# Patient Record
Sex: Male | Born: 1980 | Race: White | Hispanic: No | Smoking: Former smoker
Health system: Southern US, Community
[De-identification: ages and names within clinical notes are randomized; demographics above are authoritative.]

## PROBLEM LIST (undated history)

## (undated) ENCOUNTER — Encounter

## (undated) DIAGNOSIS — C8192 Hodgkin lymphoma, unspecified, intrathoracic lymph nodes: Principal | ICD-10-CM

## (undated) DIAGNOSIS — M549 Dorsalgia, unspecified: Secondary | ICD-10-CM

## (undated) DIAGNOSIS — F1011 Alcohol abuse, in remission: Secondary | ICD-10-CM

## (undated) DIAGNOSIS — I10 Essential (primary) hypertension: Secondary | ICD-10-CM

## (undated) DIAGNOSIS — J189 Pneumonia, unspecified organism: Secondary | ICD-10-CM

## (undated) DIAGNOSIS — R011 Cardiac murmur, unspecified: Secondary | ICD-10-CM

## (undated) DIAGNOSIS — F419 Anxiety disorder, unspecified: Secondary | ICD-10-CM

## (undated) DIAGNOSIS — G8929 Other chronic pain: Secondary | ICD-10-CM

## (undated) DIAGNOSIS — J9859 Other diseases of mediastinum, not elsewhere classified: Secondary | ICD-10-CM

## (undated) DIAGNOSIS — C801 Malignant (primary) neoplasm, unspecified: Secondary | ICD-10-CM

## (undated) HISTORY — PX: BACK SURGERY: SHX140

## (undated) HISTORY — DX: Hodgkin lymphoma, unspecified, intrathoracic lymph nodes: C81.92

---

## 2007-01-26 ENCOUNTER — Ambulatory Visit: Payer: Self-pay | Admitting: Family Medicine

## 2008-07-03 ENCOUNTER — Ambulatory Visit (HOSPITAL_COMMUNITY): Admission: RE | Admit: 2008-07-03 | Discharge: 2008-07-03 | Payer: Self-pay | Admitting: Orthopedic Surgery

## 2008-07-17 ENCOUNTER — Ambulatory Visit (HOSPITAL_COMMUNITY): Admission: RE | Admit: 2008-07-17 | Discharge: 2008-07-18 | Payer: Self-pay | Admitting: Orthopedic Surgery

## 2008-08-20 ENCOUNTER — Encounter: Admission: RE | Admit: 2008-08-20 | Discharge: 2008-10-08 | Payer: Self-pay | Admitting: Orthopedic Surgery

## 2009-12-22 ENCOUNTER — Emergency Department (HOSPITAL_BASED_OUTPATIENT_CLINIC_OR_DEPARTMENT_OTHER): Admission: EM | Admit: 2009-12-22 | Discharge: 2009-12-22 | Payer: Self-pay | Admitting: Emergency Medicine

## 2010-02-25 ENCOUNTER — Observation Stay (HOSPITAL_COMMUNITY): Admission: RE | Admit: 2010-02-25 | Discharge: 2010-02-26 | Payer: Self-pay | Admitting: Orthopedic Surgery

## 2010-02-25 ENCOUNTER — Encounter (INDEPENDENT_AMBULATORY_CARE_PROVIDER_SITE_OTHER): Payer: Self-pay | Admitting: Orthopedic Surgery

## 2010-04-06 ENCOUNTER — Encounter: Admission: RE | Admit: 2010-04-06 | Discharge: 2010-07-05 | Payer: Self-pay | Admitting: Orthopedic Surgery

## 2010-11-21 ENCOUNTER — Encounter: Payer: Self-pay | Admitting: Orthopedic Surgery

## 2011-01-18 LAB — PROTIME-INR: INR: 0.89 (ref 0.00–1.49)

## 2011-01-18 LAB — APTT: aPTT: 28 seconds (ref 24–37)

## 2011-01-18 LAB — COMPREHENSIVE METABOLIC PANEL
ALT: 19 U/L (ref 0–53)
Alkaline Phosphatase: 47 U/L (ref 39–117)
BUN: 9 mg/dL (ref 6–23)
CO2: 29 mEq/L (ref 19–32)
Chloride: 101 mEq/L (ref 96–112)
Creatinine, Ser: 1.11 mg/dL (ref 0.4–1.5)
GFR calc non Af Amer: >60 mL/min (ref >60)
Potassium: 4 mEq/L (ref 3.5–5.1)
Total Protein: 7.4 g/dL (ref 6.0–8.3)

## 2011-01-18 LAB — DIFFERENTIAL
Eosinophils Absolute: 0.2 10*3/uL (ref 0.0–0.7)
Eosinophils Relative: 3 % (ref 0–5)
Monocytes Absolute: 0.5 10*3/uL (ref 0.1–1.0)
Neutro Abs: 3.3 10*3/uL (ref 1.7–7.7)
Neutrophils Relative %: 53 % (ref 43–77)

## 2011-01-18 LAB — URINALYSIS, ROUTINE W REFLEX MICROSCOPIC
Glucose, UA: NEGATIVE mg/dL
Protein, ur: NEGATIVE mg/dL
pH: 7 (ref 5.0–8.0)

## 2011-01-18 LAB — CBC
HCT: 45 % (ref 39.0–52.0)
Hemoglobin: 15.4 g/dL (ref 13.0–17.0)
Platelets: 299 10*3/uL (ref 150–400)

## 2011-03-15 NOTE — Op Note (Signed)
NAME:  Kevin Reese, Kevin Reese            ACCOUNT NO.:  [id number]   MEDICAL RECORD NO.:  [id number]          PATIENT TYPE:  OIB   LOCATION:  1535                         FACILITY:  [facility]   PHYSICIAN:  Georges Lynch. Gioffre, M.D.DATE OF BIRTH:  Sep 05, 1981   DATE OF PROCEDURE:  07/17/2008  DATE OF DISCHARGE:  07/18/2008                               OPERATIVE REPORT   SURGEON:  Windy Fast A. Darrelyn Hillock, M.D.   ASSISTANT:  Marlowe Kays, M.D.   PREOPERATIVE DIAGNOSES:  1. 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  2. 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  3. Foot drop on the left.   POSTOPERATIVE DIAGNOSES:  1. 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  2. 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  3. Foot drop on the left.   OPERATION:  1. Decompression lateral recess with lateral recess stenosis at L4-5      on the left.  2. Microdiskectomy at L4-5 on the left for an extremely large ruptured      disk.   PROCEDURE IN DETAIL:  Sterile prep and draping of the left lower  extremity was carried out.  He had 2 grams of IV Ancef preop.  At this  time two needles were placed in the back for localization purposes and  an x-ray was taken.  Following that an incision was made over the  appropriate level.  Bleeders identified and cauterized.  The muscle was  stripped from the lamina and spinous processes bilaterally in order to  insert the [facility] retractors.  Following that I went down to the L4-  5 space on the left and another x-ray was taken to verify our position.  We then carried out hemilaminectomy in the usual fashion.  Note the dura  and root was extremely tight.  The recess was extremely tight as well.  We brought the microscope in, gently protected the dura and then went  out far laterally to decompress first the lateral recess.  We then went  down and did a foraminotomy for the S1 root.  Then we proceeded  proximally and  completed our dissection.  Following that we gently  brought the dura and root over the midline and went down and utilized a  nerve hook and removed an extremely large piece of disk.  Once that was  removed we now had good freedom of motion of the dura and the root.  We  then made a cruciate incision in the posterior longitudinal ligament and  went down and completed our diskectomy.  We went up proximally, distally  and medially and made sure there were no other loose fragments.  We  utilized the Epstein curette to go subligamentous as well as a nerve  hook.  We did a nice foraminotomy and freed up the S1 root.  Following  that we thoroughly irrigated out the area and the dura and root now were  easily movable.  We went back and made several attempts to make sure  there were no other free pieces of disk.  I then  loosely applied some  thrombin-soaked Gelfoam for hemostasis purposes and then closed the  wound in layers in the usual fashion except I left a small distal deep  part of the wound open for drainage purposes.  The subcu was closed with  Vicryl and the skin was closed with metal staples.  A sterile Neosporin  dressing was applied.  The patient left the operating room in  satisfactory condition.  He lost approximately 75 mL of blood.           ______________________________  Georges Lynch Darrelyn Hillock, M.D.     RAG/MEDQ  D:  07/17/2008  T:  07/20/2008  Job:  [id number]

## 2011-08-01 LAB — HEMOGLOBIN: Hemoglobin: 13.6

## 2011-08-01 LAB — RAPID URINE DRUG SCREEN, HOSP PERFORMED
Cocaine: NOT DETECTED
Opiates: POSITIVE — AB

## 2011-08-03 LAB — URINALYSIS, ROUTINE W REFLEX MICROSCOPIC
Bilirubin Urine: NEGATIVE
Leukocytes, UA: NEGATIVE
Nitrite: NEGATIVE
Specific Gravity, Urine: 1.013
pH: 7

## 2011-08-03 LAB — DIFFERENTIAL
Basophils Absolute: 0.1
Lymphocytes Relative: 28
Monocytes Absolute: 0.6
Monocytes Relative: 7
Neutro Abs: 5.5

## 2011-08-03 LAB — RAPID URINE DRUG SCREEN, HOSP PERFORMED
Barbiturates: NOT DETECTED
Benzodiazepines: POSITIVE — AB
Cocaine: POSITIVE — AB

## 2011-08-03 LAB — COMPREHENSIVE METABOLIC PANEL
Albumin: 5.1
BUN: 10
Creatinine, Ser: 1.16
Total Protein: 8.3

## 2011-08-03 LAB — CBC
HCT: 50.4
MCHC: 34.7
MCV: 87
Platelets: 338
RDW: 13.5

## 2011-08-03 LAB — URINE CULTURE: Special Requests: NEGATIVE

## 2011-08-03 LAB — PROTIME-INR: INR: 1

## 2011-08-03 LAB — APTT: aPTT: 28

## 2011-09-03 ENCOUNTER — Encounter: Payer: Self-pay | Admitting: *Deleted

## 2011-09-03 ENCOUNTER — Emergency Department (INDEPENDENT_AMBULATORY_CARE_PROVIDER_SITE_OTHER): Payer: No Typology Code available for payment source

## 2011-09-03 ENCOUNTER — Emergency Department (HOSPITAL_BASED_OUTPATIENT_CLINIC_OR_DEPARTMENT_OTHER): Payer: No Typology Code available for payment source

## 2011-09-03 ENCOUNTER — Emergency Department (HOSPITAL_BASED_OUTPATIENT_CLINIC_OR_DEPARTMENT_OTHER)
Admission: EM | Admit: 2011-09-03 | Discharge: 2011-09-04 | Disposition: A | Discharge status: Home / Self Care | Payer: No Typology Code available for payment source | Attending: Emergency Medicine | Admitting: Emergency Medicine

## 2011-09-03 DIAGNOSIS — S0003XA Contusion of scalp, initial encounter: Secondary | ICD-10-CM

## 2011-09-03 DIAGNOSIS — Z9119 Patient's noncompliance with other medical treatment and regimen: Secondary | ICD-10-CM | POA: Insufficient documentation

## 2011-09-03 DIAGNOSIS — R222 Localized swelling, mass and lump, trunk: Secondary | ICD-10-CM | POA: Insufficient documentation

## 2011-09-03 DIAGNOSIS — M5137 Other intervertebral disc degeneration, lumbosacral region: Secondary | ICD-10-CM

## 2011-09-03 DIAGNOSIS — S1093XA Contusion of unspecified part of neck, initial encounter: Secondary | ICD-10-CM

## 2011-09-03 DIAGNOSIS — S335XXA Sprain of ligaments of lumbar spine, initial encounter: Secondary | ICD-10-CM | POA: Insufficient documentation

## 2011-09-03 DIAGNOSIS — M549 Dorsalgia, unspecified: Secondary | ICD-10-CM

## 2011-09-03 DIAGNOSIS — W19XXXA Unspecified fall, initial encounter: Secondary | ICD-10-CM

## 2011-09-03 DIAGNOSIS — S39012A Strain of muscle, fascia and tendon of lower back, initial encounter: Secondary | ICD-10-CM

## 2011-09-03 DIAGNOSIS — J9859 Other diseases of mediastinum, not elsewhere classified: Secondary | ICD-10-CM

## 2011-09-03 DIAGNOSIS — I10 Essential (primary) hypertension: Secondary | ICD-10-CM | POA: Insufficient documentation

## 2011-09-03 DIAGNOSIS — M79609 Pain in unspecified limb: Secondary | ICD-10-CM

## 2011-09-03 DIAGNOSIS — R109 Unspecified abdominal pain: Secondary | ICD-10-CM

## 2011-09-03 DIAGNOSIS — K7689 Other specified diseases of liver: Secondary | ICD-10-CM

## 2011-09-03 DIAGNOSIS — Z91199 Patient's noncompliance with other medical treatment and regimen due to unspecified reason: Secondary | ICD-10-CM | POA: Insufficient documentation

## 2011-09-03 DIAGNOSIS — R079 Chest pain, unspecified: Secondary | ICD-10-CM

## 2011-09-03 DIAGNOSIS — M5126 Other intervertebral disc displacement, lumbar region: Secondary | ICD-10-CM

## 2011-09-03 DIAGNOSIS — IMO0002 Reserved for concepts with insufficient information to code with codable children: Secondary | ICD-10-CM

## 2011-09-03 HISTORY — DX: Anxiety disorder, unspecified: F41.9

## 2011-09-03 HISTORY — DX: Essential (primary) hypertension: I10

## 2011-09-03 LAB — DIFFERENTIAL
Lymphocytes Relative: 23 % (ref 12–46)
Monocytes Absolute: 0.7 10*3/uL (ref 0.1–1.0)
Monocytes Relative: 6 % (ref 3–12)
Neutro Abs: 8.4 10*3/uL — ABNORMAL HIGH (ref 1.7–7.7)
Neutrophils Relative %: 69 % (ref 43–77)

## 2011-09-03 LAB — CBC
HCT: 44 % (ref 39.0–52.0)
Hemoglobin: 15.3 g/dL (ref 13.0–17.0)
RBC: 5.3 MIL/uL (ref 4.22–5.81)
WBC: 12.2 10*3/uL — ABNORMAL HIGH (ref 4.0–10.5)

## 2011-09-03 MED ORDER — LIDOCAINE HCL 2 % IJ SOLN
INTRAMUSCULAR | Status: AC
  Administered 2011-09-03: 400 mg via INTRADERMAL
  Filled 2011-09-03: qty 1

## 2011-09-03 MED ORDER — OXYCODONE-ACETAMINOPHEN 5-325 MG PO TABS
2.0000 | ORAL_TABLET | Freq: Once | ORAL | Status: AC
  Administered 2011-09-03: 2 via ORAL
  Filled 2011-09-03: qty 2

## 2011-09-03 MED ORDER — TETANUS-DIPHTH-ACELL PERTUSSIS 5-2.5-18.5 LF-MCG/0.5 IM SUSP
0.5000 mL | Freq: Once | INTRAMUSCULAR | Status: AC
  Administered 2011-09-03: 0.5 mL via INTRAMUSCULAR
  Filled 2011-09-03: qty 0.5

## 2011-09-03 MED ORDER — LIDOCAINE HCL 2 % IJ SOLN
20.0000 mL | Freq: Once | INTRAMUSCULAR | Status: AC
  Administered 2011-09-03: 400 mg via INTRADERMAL

## 2011-09-03 MED ORDER — IBUPROFEN 800 MG PO TABS: 800.0000 mg | ORAL_TABLET | Freq: Three times a day (TID) | ORAL | Status: AC

## 2011-09-03 MED ORDER — HYDROCODONE-ACETAMINOPHEN 5-325 MG PO TABS: 2.0000 | ORAL_TABLET | ORAL | Status: AC | PRN
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
## 2011-09-03 NOTE — ED Notes (Signed)
Spine precautions d/c'd by dr Manus Gunning upon arrival to the ed.  Pt was initally on LSB and C-Collar

## 2011-09-04 LAB — COMPREHENSIVE METABOLIC PANEL
AST: 29 U/L (ref 0–37)
Albumin: 4.4 g/dL (ref 3.5–5.2)
Alkaline Phosphatase: 69 U/L (ref 39–117)
BUN: 11 mg/dL (ref 6–23)
CO2: 24 mEq/L (ref 19–32)
Chloride: 100 mEq/L (ref 96–112)
Creatinine, Ser: 1 mg/dL (ref 0.50–1.35)
GFR calc non Af Amer: >90 mL/min (ref >90)
Potassium: 3.6 mEq/L (ref 3.5–5.1)
Total Bilirubin: 0.4 mg/dL (ref 0.3–1.2)

## 2011-09-04 MED ORDER — IOHEXOL 300 MG/ML  SOLN
100.0000 mL | Freq: Once | INTRAMUSCULAR | Status: AC | PRN
  Administered 2011-09-04: 100 mL via INTRAVENOUS

## 2011-09-04 MED ORDER — HYDROCODONE-ACETAMINOPHEN 5-325 MG PO TABS: 2.0000 | ORAL_TABLET | ORAL | Status: AC | PRN

## 2011-09-04 MED ORDER — IBUPROFEN 600 MG PO TABS: 600.0000 mg | ORAL_TABLET | Freq: Four times a day (QID) | ORAL | Status: AC | PRN

## 2011-09-05 ENCOUNTER — Telehealth: Payer: Self-pay | Admitting: Hematology & Oncology

## 2011-09-05 NOTE — Telephone Encounter (Signed)
Pt's friend Rosanne Sack called MD aware pt was in ER and needs to be worked in this week.

## 2011-09-05 NOTE — Telephone Encounter (Signed)
Received call from [facility] said Pt was in ER over the weekend and needed to schedule appointment with MD. I told her I would call her back after I talk to MD at [phone]

## 2011-09-05 NOTE — Telephone Encounter (Signed)
Pt is aware of 09-07-11 appointment

## 2011-09-06 DIAGNOSIS — J9859 Other diseases of mediastinum, not elsewhere classified: Secondary | ICD-10-CM | POA: Insufficient documentation

## 2011-09-07 ENCOUNTER — Ambulatory Visit: Payer: Self-pay

## 2011-09-07 ENCOUNTER — Other Ambulatory Visit: Payer: Self-pay | Admitting: Lab

## 2011-09-07 ENCOUNTER — Ambulatory Visit (HOSPITAL_BASED_OUTPATIENT_CLINIC_OR_DEPARTMENT_OTHER): Payer: Self-pay | Admitting: Hematology & Oncology

## 2011-09-07 VITALS — BP 159/99 | HR 85 | Temp 97.1°F | Ht 70.0 in | Wt 253.0 lb

## 2011-09-07 DIAGNOSIS — J9859 Other diseases of mediastinum, not elsewhere classified: Secondary | ICD-10-CM

## 2011-09-07 DIAGNOSIS — R222 Localized swelling, mass and lump, trunk: Secondary | ICD-10-CM

## 2011-09-07 LAB — CBC WITH DIFFERENTIAL (CANCER CENTER ONLY)
BASO%: 1 % (ref 0.0–2.0)
EOS%: 4.3 % (ref 0.0–7.0)
Eosinophils Absolute: 0.3 10*3/uL (ref 0.0–0.5)
LYMPH%: 24.7 % (ref 14.0–48.0)
MCH: 29.5 pg (ref 28.0–33.4)
MCV: 83 fL (ref 82–98)
MONO%: 7.9 % (ref 0.0–13.0)
NEUT#: 4.1 10*3/uL (ref 1.5–6.5)
Platelets: 329 10*3/uL (ref 145–400)
RBC: 5.22 10*6/uL (ref 4.20–5.70)
RDW: 12.8 % (ref 11.1–15.7)

## 2011-09-07 LAB — CHCC SATELLITE - SMEAR

## 2011-09-07 NOTE — Progress Notes (Signed)
CSN: [id number] This office note has been dictated. ENNEVER,PETER R 09/07/2011 #[id number]
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
## 2011-09-08 ENCOUNTER — Telehealth: Payer: Self-pay | Admitting: Hematology & Oncology

## 2011-09-08 NOTE — Telephone Encounter (Signed)
Called pt to schedule PET no answer or machine. Scheduled with Lyla Son for 11-16 at 1130am at [facility]

## 2011-09-08 NOTE — Telephone Encounter (Signed)
Pt aware of 11-16 PET he knows to be NPO 6 hrs

## 2011-09-10 LAB — COMPREHENSIVE METABOLIC PANEL
ALT: 18 U/L (ref 0–53)
AST: 25 U/L (ref 0–37)
Alkaline Phosphatase: 59 U/L (ref 39–117)
Creatinine, Ser: 1.1 mg/dL (ref 0.50–1.35)
Sodium: 137 mEq/L (ref 135–145)
Total Bilirubin: 0.4 mg/dL (ref 0.3–1.2)
Total Protein: 7.4 g/dL (ref 6.0–8.3)

## 2011-09-10 LAB — LACTATE DEHYDROGENASE: LDH: 180 U/L (ref 94–250)

## 2011-09-16 ENCOUNTER — Encounter (HOSPITAL_COMMUNITY): Payer: Self-pay

## 2011-09-16 ENCOUNTER — Encounter (HOSPITAL_COMMUNITY)
Admission: RE | Admit: 2011-09-16 | Discharge: 2011-09-16 | Disposition: A | Discharge status: Home / Self Care | Payer: Self-pay | Source: Ambulatory Visit | Attending: Hematology & Oncology | Admitting: Hematology & Oncology

## 2011-09-16 ENCOUNTER — Encounter: Payer: Self-pay | Admitting: Hematology & Oncology

## 2011-09-16 ENCOUNTER — Telehealth: Payer: Self-pay | Admitting: *Deleted

## 2011-09-16 DIAGNOSIS — R222 Localized swelling, mass and lump, trunk: Secondary | ICD-10-CM | POA: Insufficient documentation

## 2011-09-16 DIAGNOSIS — R599 Enlarged lymph nodes, unspecified: Secondary | ICD-10-CM | POA: Insufficient documentation

## 2011-09-16 DIAGNOSIS — J9859 Other diseases of mediastinum, not elsewhere classified: Secondary | ICD-10-CM

## 2011-09-16 DIAGNOSIS — I517 Cardiomegaly: Secondary | ICD-10-CM | POA: Insufficient documentation

## 2011-09-16 DIAGNOSIS — K7689 Other specified diseases of liver: Secondary | ICD-10-CM | POA: Insufficient documentation

## 2011-09-16 HISTORY — DX: Other diseases of mediastinum, not elsewhere classified: J98.59

## 2011-09-16 LAB — GLUCOSE, CAPILLARY: Glucose-Capillary: 136 mg/dL — ABNORMAL HIGH (ref 70–99)

## 2011-09-16 MED ORDER — FLUDEOXYGLUCOSE F - 18 (FDG) INJECTION
18.2000 | Freq: Once | INTRAVENOUS | Status: AC | PRN
  Administered 2011-09-16: 18.2 via INTRAVENOUS

## 2011-09-16 NOTE — Progress Notes (Signed)
Patient has no insurance.  Contact patient for EPP.

## 2011-09-19 ENCOUNTER — Telehealth: Payer: Self-pay | Admitting: Hematology & Oncology

## 2011-09-19 ENCOUNTER — Encounter: Payer: Self-pay | Admitting: *Deleted

## 2011-09-19 NOTE — Telephone Encounter (Signed)
I left a note for pt's mom.

## 2011-09-19 NOTE — Telephone Encounter (Signed)
On 09/16/11 pt's pharmacy entered.

## 2011-09-20 ENCOUNTER — Other Ambulatory Visit: Payer: Self-pay

## 2011-09-20 ENCOUNTER — Institutional Professional Consult (permissible substitution) (INDEPENDENT_AMBULATORY_CARE_PROVIDER_SITE_OTHER): Payer: No Typology Code available for payment source | Admitting: Thoracic Surgery (Cardiothoracic Vascular Surgery)

## 2011-09-20 ENCOUNTER — Encounter: Payer: Self-pay | Admitting: Thoracic Surgery (Cardiothoracic Vascular Surgery)

## 2011-09-20 VITALS — BP 190/108 | HR 74 | Resp 20 | Ht 71.0 in | Wt 250.0 lb

## 2011-09-20 DIAGNOSIS — R222 Localized swelling, mass and lump, trunk: Secondary | ICD-10-CM

## 2011-09-20 DIAGNOSIS — J9859 Other diseases of mediastinum, not elsewhere classified: Secondary | ICD-10-CM
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
## 2011-09-26 ENCOUNTER — Encounter (HOSPITAL_COMMUNITY): Payer: Self-pay | Admitting: Pharmacy Technician

## 2011-09-28 ENCOUNTER — Other Ambulatory Visit: Payer: Self-pay

## 2011-09-28 ENCOUNTER — Encounter (HOSPITAL_COMMUNITY)
Admission: RE | Admit: 2011-09-28 | Discharge: 2011-09-28 | Disposition: A | Discharge status: Home / Self Care | Payer: Self-pay | Source: Ambulatory Visit | Attending: Thoracic Surgery (Cardiothoracic Vascular Surgery) | Admitting: Thoracic Surgery (Cardiothoracic Vascular Surgery)

## 2011-09-28 ENCOUNTER — Encounter (HOSPITAL_COMMUNITY): Payer: Self-pay

## 2011-09-28 ENCOUNTER — Telehealth: Payer: Self-pay

## 2011-09-28 DIAGNOSIS — R222 Localized swelling, mass and lump, trunk: Secondary | ICD-10-CM

## 2011-09-28 DIAGNOSIS — I10 Essential (primary) hypertension: Secondary | ICD-10-CM

## 2011-09-28 HISTORY — DX: Cardiac murmur, unspecified: R01.1

## 2011-09-28 HISTORY — DX: Other chronic pain: G89.29

## 2011-09-28 HISTORY — DX: Dorsalgia, unspecified: M54.9

## 2011-09-28 LAB — APTT: aPTT: 28 seconds (ref 24–37)

## 2011-09-28 LAB — URINALYSIS, ROUTINE W REFLEX MICROSCOPIC
Bilirubin Urine: NEGATIVE
Ketones, ur: NEGATIVE mg/dL
Leukocytes, UA: NEGATIVE
Nitrite: NEGATIVE
Protein, ur: NEGATIVE mg/dL

## 2011-09-28 LAB — CBC
MCH: 28.9 pg (ref 26.0–34.0)
Platelets: 334 10*3/uL (ref 150–400)
RBC: 5.37 MIL/uL (ref 4.22–5.81)

## 2011-09-28 LAB — COMPREHENSIVE METABOLIC PANEL
ALT: 17 U/L (ref 0–53)
AST: 25 U/L (ref 0–37)
Albumin: 4.4 g/dL (ref 3.5–5.2)
CO2: 23 mEq/L (ref 19–32)
Calcium: 9.9 mg/dL (ref 8.4–10.5)
Sodium: 137 mEq/L (ref 135–145)
Total Protein: 7.9 g/dL (ref 6.0–8.3)

## 2011-09-28 LAB — BLOOD GAS, ARTERIAL
Drawn by: 206361
FIO2: 0.21 %
O2 Saturation: 96.9 %
pCO2 arterial: 36.6 mmHg (ref 35.0–45.0)
pO2, Arterial: 76.3 mmHg — ABNORMAL LOW (ref 80.0–100.0)

## 2011-09-28 MED ORDER — METOPROLOL TARTRATE 25 MG PO TABS: 25.0000 mg | ORAL_TABLET | Freq: Two times a day (BID) | ORAL | Status: DC

## 2011-09-28 NOTE — Progress Notes (Signed)
Notified Revonda Standard of intial b/p of 184/120,repeat 181/120;unable to obtain meds d/t can not afford them and has been off of them for a year.To see pt
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
## 2011-09-28 NOTE — Telephone Encounter (Signed)
Dr Dorris Fetch will prescribe Lopressor 25 mg po BID to start tonight and will re check BP on Friday at short stay. Rx sent to [facility] in [place] via [employer]

## 2011-09-28 NOTE — Pre-Procedure Instructions (Signed)
20 Kevin Reese  09/28/2011   Your procedure is scheduled on:  Fri,Nov 30 @ 0730  Report to [facility] at 5:30 AM.  Call this number if you have problems the morning of surgery: [phone]   Remember:   Do not eat food:After Midnight.  May have clear liquids: up to 4 Hours before arrival.  Clear liquids include soda, tea, black coffee, apple or grape juice, broth.  Take these medicines the morning of surgery with A SIP OF WATER: Clonazepam,Pain Pill(if needed)   Do not wear jewelry, make-up or nail polish.  Do not wear lotions, powders, or perfumes. You may wear deodorant.  Do not shave 48 hours prior to surgery.  Do not bring valuables to the hospital.  Contacts, dentures or bridgework may not be worn into surgery.  Leave suitcase in the car. After surgery it may be brought to your room.  For patients admitted to the hospital, checkout time is 11:00 AM the day of discharge.   Patients discharged the day of surgery will not be allowed to drive home.  Name and phone number of your driver:   Special Instructions: CHG Shower Use Special Wash: 1/2 bottle night before surgery and 1/2 bottle morning of surgery.   Please read over the following fact sheets that you were given: Pain Booklet, Coughing and Deep Breathing, Blood Transfusion Information, MRSA Information and Surgical Site Infection Prevention

## 2011-09-28 NOTE — Progress Notes (Signed)
Pt was on Azor and Benazepril/Amlodipine that was started in 2009;has been off x 1 yr d/t unable to afford medications

## 2011-09-29 MED ORDER — SODIUM CHLORIDE 0.9 % IV SOLN
1500.0000 mg | INTRAVENOUS | Status: AC
  Administered 2011-09-30: 1500 mg via INTRAVENOUS
  Filled 2011-09-29 (×3): qty 1500

## 2011-09-29 NOTE — Progress Notes (Signed)
Contacted patient notified of surgery time change. New arrival time is 0530am.

## 2011-09-30 ENCOUNTER — Inpatient Hospital Stay (HOSPITAL_COMMUNITY): Payer: Self-pay

## 2011-09-30 ENCOUNTER — Encounter (HOSPITAL_COMMUNITY): Payer: Self-pay | Admitting: *Deleted

## 2011-09-30 ENCOUNTER — Other Ambulatory Visit: Payer: Self-pay | Admitting: Thoracic Surgery (Cardiothoracic Vascular Surgery)

## 2011-09-30 ENCOUNTER — Encounter (HOSPITAL_COMMUNITY): Payer: Self-pay | Admitting: Vascular Surgery

## 2011-09-30 ENCOUNTER — Inpatient Hospital Stay (HOSPITAL_COMMUNITY): Payer: Self-pay | Admitting: Vascular Surgery

## 2011-09-30 ENCOUNTER — Encounter (HOSPITAL_COMMUNITY)
Admission: RE | Disposition: A | Discharge status: Home / Self Care | Payer: Self-pay | Source: Ambulatory Visit | Attending: Thoracic Surgery (Cardiothoracic Vascular Surgery)

## 2011-09-30 ENCOUNTER — Inpatient Hospital Stay (HOSPITAL_COMMUNITY)
Admission: RE | Admit: 2011-09-30 | Discharge: 2011-10-03 | DRG: 164 | Disposition: A | Discharge status: Home / Self Care | Payer: MEDICAID | Source: Ambulatory Visit | Attending: Thoracic Surgery (Cardiothoracic Vascular Surgery) | Admitting: Thoracic Surgery (Cardiothoracic Vascular Surgery)

## 2011-09-30 DIAGNOSIS — R222 Localized swelling, mass and lump, trunk: Principal | ICD-10-CM | POA: Diagnosis present

## 2011-09-30 DIAGNOSIS — F17200 Nicotine dependence, unspecified, uncomplicated: Secondary | ICD-10-CM | POA: Diagnosis present

## 2011-09-30 DIAGNOSIS — I10 Essential (primary) hypertension: Secondary | ICD-10-CM

## 2011-09-30 DIAGNOSIS — Z01812 Encounter for preprocedural laboratory examination: Secondary | ICD-10-CM

## 2011-09-30 DIAGNOSIS — D62 Acute posthemorrhagic anemia: Secondary | ICD-10-CM | POA: Diagnosis not present

## 2011-09-30 LAB — POCT I-STAT 7, (LYTES, BLD GAS, ICA,H+H)
Acid-base deficit: 4 mmol/L — ABNORMAL HIGH (ref 0.0–2.0)
Bicarbonate: 23.7 mEq/L (ref 20.0–24.0)
Hemoglobin: 12.2 g/dL — ABNORMAL LOW (ref 13.0–17.0)
Potassium: 3.9 mEq/L (ref 3.5–5.1)
Sodium: 139 mEq/L (ref 135–145)
TCO2: 25 mmol/L (ref 0–100)
pH, Arterial: 7.259 — ABNORMAL LOW (ref 7.350–7.450)

## 2011-09-30 LAB — GLUCOSE, CAPILLARY: Glucose-Capillary: 185 mg/dL — ABNORMAL HIGH (ref 70–99)

## 2011-09-30 SURGERY — VIDEO ASSISTED THORACOSCOPY (VATS)/WEDGE RESECTION
Anesthesia: General | Site: Chest | Laterality: Left | Wound class: Clean Contaminated

## 2011-09-30 MED ORDER — DEXTROSE-NACL 5-0.9 % IV SOLN
INTRAVENOUS | Status: DC
  Administered 2011-09-30 – 2011-10-01 (×2): via INTRAVENOUS

## 2011-09-30 MED ORDER — OXYCODONE HCL 5 MG PO TABS
5.0000 mg | ORAL_TABLET | ORAL | Status: AC | PRN
  Administered 2011-10-01 (×2): 10 mg via ORAL
  Filled 2011-09-30 (×2): qty 2

## 2011-09-30 MED ORDER — OXYCODONE-ACETAMINOPHEN 5-325 MG PO TABS
1.0000 | ORAL_TABLET | ORAL | Status: DC | PRN
  Administered 2011-10-01 – 2011-10-02 (×6): 2 via ORAL
  Administered 2011-10-03: 1 via ORAL
  Administered 2011-10-03: 2 via ORAL
  Filled 2011-09-30 (×8): qty 2

## 2011-09-30 MED ORDER — SODIUM CHLORIDE 0.9 % IJ SOLN: 9.0000 mL | INTRAMUSCULAR | Status: DC | PRN

## 2011-09-30 MED ORDER — ONDANSETRON HCL 4 MG/2ML IJ SOLN
INTRAMUSCULAR | Status: DC | PRN
  Administered 2011-09-30 (×2): 4 mg via INTRAVENOUS

## 2011-09-30 MED ORDER — ENOXAPARIN SODIUM 40 MG/0.4ML ~~LOC~~ SOLN
40.0000 mg | SUBCUTANEOUS | Status: DC
  Administered 2011-09-30 – 2011-10-02 (×3): 40 mg via SUBCUTANEOUS
  Filled 2011-09-30 (×4): qty 0.4

## 2011-09-30 MED ORDER — DIPHENHYDRAMINE HCL 50 MG/ML IJ SOLN: 12.5000 mg | Freq: Four times a day (QID) | INTRAMUSCULAR | Status: DC | PRN

## 2011-09-30 MED ORDER — HYDRALAZINE HCL 20 MG/ML IJ SOLN
INTRAMUSCULAR | Status: DC | PRN
  Administered 2011-09-30 (×4): 5 mg via INTRAVENOUS

## 2011-09-30 MED ORDER — LABETALOL HCL 5 MG/ML IV SOLN
INTRAVENOUS | Status: DC | PRN
  Administered 2011-09-30: 5 mg via INTRAVENOUS

## 2011-09-30 MED ORDER — ROCURONIUM BROMIDE 100 MG/10ML IV SOLN
INTRAVENOUS | Status: DC | PRN
  Administered 2011-09-30: 50 mg via INTRAVENOUS

## 2011-09-30 MED ORDER — ONDANSETRON HCL 4 MG/2ML IJ SOLN: 4.0000 mg | Freq: Four times a day (QID) | INTRAMUSCULAR | Status: DC | PRN

## 2011-09-30 MED ORDER — VECURONIUM BROMIDE 10 MG IV SOLR
INTRAVENOUS | Status: DC | PRN
  Administered 2011-09-30: 3 mg via INTRAVENOUS
  Administered 2011-09-30: 2 mg via INTRAVENOUS
  Administered 2011-09-30: 5 mg via INTRAVENOUS

## 2011-09-30 MED ORDER — SODIUM CHLORIDE 0.9 % IR SOLN
Status: DC | PRN
  Administered 2011-09-30: 2000 mL

## 2011-09-30 MED ORDER — TRAMADOL HCL 50 MG PO TABS: 50.0000 mg | ORAL_TABLET | Freq: Four times a day (QID) | ORAL | Status: DC | PRN

## 2011-09-30 MED ORDER — LACTATED RINGERS IV SOLN
INTRAVENOUS | Status: DC | PRN
  Administered 2011-09-30 (×2): via INTRAVENOUS

## 2011-09-30 MED ORDER — PROPOFOL 10 MG/ML IV EMUL
INTRAVENOUS | Status: DC | PRN
  Administered 2011-09-30: 400 mg via INTRAVENOUS

## 2011-09-30 MED ORDER — FENTANYL CITRATE 0.05 MG/ML IJ SOLN
INTRAMUSCULAR | Status: DC | PRN
  Administered 2011-09-30 (×3): 50 ug via INTRAVENOUS
  Administered 2011-09-30: 200 ug via INTRAVENOUS
  Administered 2011-09-30: 100 ug via INTRAVENOUS
  Administered 2011-09-30 (×2): 50 ug via INTRAVENOUS
  Administered 2011-09-30: 200 ug via INTRAVENOUS

## 2011-09-30 MED ORDER — LACTATED RINGERS IV SOLN
INTRAVENOUS | Status: DC | PRN
  Administered 2011-09-30 (×2): via INTRAVENOUS

## 2011-09-30 MED ORDER — HETASTARCH-ELECTROLYTES 6 % IV SOLN
INTRAVENOUS | Status: DC | PRN
  Administered 2011-09-30: 10:00:00 via INTRAVENOUS

## 2011-09-30 MED ORDER — POTASSIUM CHLORIDE 10 MEQ/50ML IV SOLN
10.0000 meq | Freq: Every day | INTRAVENOUS | Status: DC | PRN
  Administered 2011-10-02 (×3): 10 meq via INTRAVENOUS
  Filled 2011-09-30 (×3): qty 50

## 2011-09-30 MED ORDER — BISACODYL 5 MG PO TBEC
10.0000 mg | DELAYED_RELEASE_TABLET | Freq: Every day | ORAL | Status: DC
  Administered 2011-10-01 – 2011-10-02 (×2): 10 mg via ORAL
  Filled 2011-09-30 (×3): qty 2

## 2011-09-30 MED ORDER — NEOSTIGMINE METHYLSULFATE 1 MG/ML IJ SOLN
INTRAMUSCULAR | Status: DC | PRN
  Administered 2011-09-30: 5 mg via INTRAVENOUS

## 2011-09-30 MED ORDER — METOPROLOL TARTRATE 1 MG/ML IV SOLN
INTRAVENOUS | Status: DC | PRN
  Administered 2011-09-30 (×2): 2.5 mg via INTRAVENOUS

## 2011-09-30 MED ORDER — ACETAMINOPHEN 10 MG/ML IV SOLN
1000.0000 mg | Freq: Four times a day (QID) | INTRAVENOUS | Status: AC
  Administered 2011-09-30 – 2011-10-01 (×3): 1000 mg via INTRAVENOUS
  Filled 2011-09-30 (×7): qty 100

## 2011-09-30 MED ORDER — FENTANYL 10 MCG/ML IV SOLN
INTRAVENOUS | Status: DC
  Administered 2011-09-30: 15 ug via INTRAVENOUS
  Administered 2011-09-30: 21:00:00 via INTRAVENOUS
  Administered 2011-10-01: 165 ug via INTRAVENOUS
  Administered 2011-10-01: 60 ug via INTRAVENOUS
  Administered 2011-10-02: 150 ug via INTRAVENOUS
  Administered 2011-10-02: 02:00:00 via INTRAVENOUS
  Filled 2011-09-30 (×4): qty 50

## 2011-09-30 MED ORDER — CLONAZEPAM 2 MG PO TABS
2.0000 mg | ORAL_TABLET | Freq: Four times a day (QID) | ORAL | Status: DC | PRN
  Administered 2011-09-30 – 2011-10-01 (×2): 2 mg via ORAL
  Administered 2011-10-01 (×2): 1 mg via ORAL
  Administered 2011-10-02 (×3): 2 mg via ORAL
  Filled 2011-09-30 (×4): qty 2
  Filled 2011-09-30 (×2): qty 1
  Filled 2011-09-30: qty 2

## 2011-09-30 MED ORDER — GLYCOPYRROLATE 0.2 MG/ML IJ SOLN
INTRAMUSCULAR | Status: DC | PRN
  Administered 2011-09-30: .8 mg via INTRAVENOUS
  Administered 2011-09-30: .2 mg via INTRAVENOUS

## 2011-09-30 MED ORDER — MIDAZOLAM HCL 5 MG/5ML IJ SOLN
INTRAMUSCULAR | Status: DC | PRN
  Administered 2011-09-30: 4 mg via INTRAVENOUS

## 2011-09-30 MED ORDER — HYDROMORPHONE HCL PF 1 MG/ML IJ SOLN
0.2500 mg | INTRAMUSCULAR | Status: DC | PRN
  Administered 2011-09-30 (×4): 0.5 mg via INTRAVENOUS

## 2011-09-30 MED ORDER — SENNOSIDES-DOCUSATE SODIUM 8.6-50 MG PO TABS
1.0000 | ORAL_TABLET | Freq: Every evening | ORAL | Status: DC | PRN
  Administered 2011-10-01: 1 via ORAL
  Filled 2011-09-30: qty 1

## 2011-09-30 MED ORDER — METOPROLOL TARTRATE 25 MG PO TABS
25.0000 mg | ORAL_TABLET | Freq: Two times a day (BID) | ORAL | Status: DC
  Administered 2011-10-01: 25 mg via ORAL
  Filled 2011-09-30 (×2): qty 1

## 2011-09-30 MED ORDER — ALBUTEROL SULFATE HFA 108 (90 BASE) MCG/ACT IN AERS
2.0000 | INHALATION_SPRAY | Freq: Four times a day (QID) | RESPIRATORY_TRACT | Status: AC
  Administered 2011-09-30 – 2011-10-02 (×7): 2 via RESPIRATORY_TRACT
  Filled 2011-09-30: qty 6.7

## 2011-09-30 MED ORDER — LABETALOL HCL 5 MG/ML IV SOLN: 10.0000 mg | INTRAVENOUS | Status: DC | PRN

## 2011-09-30 MED ORDER — DIPHENHYDRAMINE HCL 12.5 MG/5ML PO ELIX
12.5000 mg | ORAL_SOLUTION | Freq: Four times a day (QID) | ORAL | Status: DC | PRN
  Filled 2011-09-30: qty 5

## 2011-09-30 MED ORDER — HYDRALAZINE HCL 20 MG/ML IJ SOLN
10.0000 mg | INTRAMUSCULAR | Status: DC | PRN
  Administered 2011-10-01 (×3): 10 mg via INTRAVENOUS
  Filled 2011-09-30 (×3): qty 0.5

## 2011-09-30 MED ORDER — LABETALOL HCL 5 MG/ML IV SOLN
10.0000 mg | INTRAVENOUS | Status: DC | PRN
  Administered 2011-09-30: 5 mg via INTRAVENOUS

## 2011-09-30 MED ORDER — NALOXONE HCL 0.4 MG/ML IJ SOLN: 0.4000 mg | INTRAMUSCULAR | Status: DC | PRN

## 2011-09-30 MED ORDER — VANCOMYCIN HCL IN DEXTROSE 1-5 GM/200ML-% IV SOLN
1000.0000 mg | Freq: Two times a day (BID) | INTRAVENOUS | Status: AC
  Administered 2011-09-30: 1000 mg via INTRAVENOUS
  Filled 2011-09-30: qty 200

## 2011-09-30 SURGICAL SUPPLY — 71 items
APPLIER CLIP 9.375 SM OPEN (CLIP) ×2
APPLIER CLIP LOGIC TI 5 (MISCELLANEOUS) ×2 IMPLANT
APPLIER CLIP ROT 10 11.4 M/L (STAPLE) ×4
APR CLP MED LRG 11.4X10 (STAPLE) ×2
APR CLP MED LRG 33X5 (MISCELLANEOUS) ×1
APR CLP SM 9.3 20 MLT OPN (CLIP) ×1
BAG SPEC RTRVL LRG 6X4 10 (ENDOMECHANICALS) ×1
CANISTER SUCTION 2500CC (MISCELLANEOUS) ×4 IMPLANT
CATH KIT ON Q 5IN SLV (PAIN MANAGEMENT) IMPLANT
CATH THORACIC 28FR (CATHETERS) IMPLANT
CATH THORACIC 36FR (CATHETERS) IMPLANT
CATH THORACIC 36FR RT ANG (CATHETERS) IMPLANT
CLIP APPLIE 9.375 SM OPEN (CLIP) ×1 IMPLANT
CLIP APPLIE ROT 10 11.4 M/L (STAPLE) ×2 IMPLANT
CLIP TI MEDIUM 24 (CLIP) ×2 IMPLANT
CLIP TI MEDIUM 6 (CLIP) ×2 IMPLANT
CLOTH BEACON ORANGE TIMEOUT ST (SAFETY) ×2 IMPLANT
CONT SPEC 4OZ CLIKSEAL STRL BL (MISCELLANEOUS) ×12 IMPLANT
DRAIN CHANNEL 28F RND 3/8 FF (WOUND CARE) ×2 IMPLANT
DRAIN CHANNEL 32F RND 10.7 FF (WOUND CARE) IMPLANT
DRAPE LAPAROSCOPIC ABDOMINAL (DRAPES) ×2 IMPLANT
DRAPE SLUSH MACHINE 52X66 (DRAPES) IMPLANT
DRAPE SLUSH/WARMER DISC (DRAPES) ×2 IMPLANT
ELECT REM PT RETURN 9FT ADLT (ELECTROSURGICAL) ×2
ELECTRODE REM PT RTRN 9FT ADLT (ELECTROSURGICAL) ×1 IMPLANT
GLOVE EUDERMIC 7 POWDERFREE (GLOVE) ×6 IMPLANT
GOWN PREVENTION PLUS XLARGE (GOWN DISPOSABLE) ×2 IMPLANT
GOWN STRL NON-REIN LRG LVL3 (GOWN DISPOSABLE) ×6 IMPLANT
KIT BASIN OR (CUSTOM PROCEDURE TRAY) ×2 IMPLANT
KIT ROOM TURNOVER OR (KITS) ×2 IMPLANT
KIT SUCTION CATH 14FR (SUCTIONS) ×2 IMPLANT
NS IRRIG 1000ML POUR BTL (IV SOLUTION) ×4 IMPLANT
PACK CHEST (CUSTOM PROCEDURE TRAY) ×2 IMPLANT
PAD ARMBOARD 7.5X6 YLW CONV (MISCELLANEOUS) ×4 IMPLANT
POUCH SPECIMEN RETRIEVAL 10MM (ENDOMECHANICALS) ×2 IMPLANT
SEALANT PROGEL (MISCELLANEOUS) IMPLANT
SEALANT SURG COSEAL 4ML (VASCULAR PRODUCTS) IMPLANT
SEALANT SURG COSEAL 8ML (VASCULAR PRODUCTS) IMPLANT
SOLUTION ANTI FOG 6CC (MISCELLANEOUS) ×2 IMPLANT
SPECIMEN JAR MEDIUM (MISCELLANEOUS) IMPLANT
SPONGE GAUZE 4X4 12PLY (GAUZE/BANDAGES/DRESSINGS) ×2 IMPLANT
SPONGE INTESTINAL PEANUT (DISPOSABLE) ×12 IMPLANT
SUT PROLENE 4 0 RB 1 (SUTURE)
SUT PROLENE 4 0 SH DA (SUTURE) ×4 IMPLANT
SUT PROLENE 4-0 RB1 .5 CRCL 36 (SUTURE) IMPLANT
SUT SILK  1 MH (SUTURE) ×1
SUT SILK 1 MH (SUTURE) ×1 IMPLANT
SUT SILK 2 0SH CR/8 30 (SUTURE) IMPLANT
SUT SILK 3 0SH CR/8 30 (SUTURE) IMPLANT
SUT VIC AB 0 CTX 27 (SUTURE) IMPLANT
SUT VIC AB 1 CTX 27 (SUTURE) IMPLANT
SUT VIC AB 2-0 CT1 27 (SUTURE)
SUT VIC AB 2-0 CT1 TAPERPNT 27 (SUTURE) IMPLANT
SUT VIC AB 2-0 CTX 36 (SUTURE) IMPLANT
SUT VIC AB 2-0 UR6 27 (SUTURE) ×2 IMPLANT
SUT VIC AB 3-0 SH 27 (SUTURE)
SUT VIC AB 3-0 SH 27X BRD (SUTURE) IMPLANT
SUT VIC AB 3-0 X1 27 (SUTURE) ×6 IMPLANT
SUT VICRYL 0 UR6 27IN ABS (SUTURE) ×4 IMPLANT
SUT VICRYL 2 TP 1 (SUTURE) IMPLANT
SWAB COLLECTION DEVICE MRSA (MISCELLANEOUS) IMPLANT
SYSTEM SAHARA CHEST DRAIN ATS (WOUND CARE) ×2 IMPLANT
TIP APPLICATOR SPRAY EXTEND 16 (VASCULAR PRODUCTS) IMPLANT
TOWEL OR 17X24 6PK STRL BLUE (TOWEL DISPOSABLE) ×2 IMPLANT
TOWEL OR 17X26 10 PK STRL BLUE (TOWEL DISPOSABLE) ×6 IMPLANT
TRAP SPECIMEN MUCOUS 40CC (MISCELLANEOUS) IMPLANT
TRAY FOLEY CATH 14FR (SET/KITS/TRAYS/PACK) ×2 IMPLANT
TUBE ANAEROBIC SPECIMEN COL (MISCELLANEOUS) IMPLANT
TUBE CONNECTING 12X1/4 (SUCTIONS) ×2 IMPLANT
TUNNELER SHEATH ON-Q 11GX8 (MISCELLANEOUS) IMPLANT
WATER STERILE IRR 1000ML POUR (IV SOLUTION) ×4 IMPLANT

## 2011-09-30 NOTE — Anesthesia Procedure Notes (Addendum)
Procedure Name: Intubation Date/Time: 09/30/2011 8:00 AM Performed by: Delbert Harness Pre-anesthesia Checklist: Patient identified, Emergency Drugs available, Suction available, Patient being monitored and Timeout performed Patient Re-evaluated:Patient Re-evaluated prior to inductionOxygen Delivery Method: Circle System Utilized Preoxygenation: Pre-oxygenation with 100% oxygen Intubation Type: IV induction Ventilation: Oral airway inserted - appropriate to patient size and Two handed mask ventilation required Laryngoscope Size: Mac and 4 Grade View: Grade II Tube type: Oral Endobronchial tube: Double lumen EBT and 39 Fr Number of attempts: 1 Airway Equipment and Method: stylet and fiberoptic brochoscope Placement Confirmation: ETT inserted through vocal cords under direct vision,  positive ETCO2 and breath sounds checked- equal and bilateral Dental Injury: Teeth and Oropharynx as per pre-operative assessment    Performed by: Rosita Fire

## 2011-09-30 NOTE — Progress Notes (Signed)
Dr. Shelly Flatten notified of pt's blood pressure. No new orders received.

## 2011-09-30 NOTE — Brief Op Note (Addendum)
09/30/2011  11:33 AM  PATIENT:  Efraim Kaufmann  30 y.o. male  PRE-OPERATIVE DIAGNOSIS:  ANTERIOR MEDIASTINAL MASS  POST-OPERATIVE DIAGNOSIS:  Anterior Mediastinal Mass  PROCEDURE:  Procedure(s):Left VIDEO ASSISTED THORACOSCOPY with resection of anterior mediastinal mass, Lymph node sampling  SURGEON:  Surgeon(s): Loreli Slot, MD  PHYSICIAN ASSISTANT: Gershon Crane PA-C  ANESTHESIA:   general  SPECIMEN:  Excision mediastinal mass, AP window lymph nodes  DISPOSITION OF SPECIMEN:  Pathology  DRAINS: (1) Blake drain(s) in the L hemithorax   PATIENT CONDITION:  PACU - hemodynamically stable.  Specimen: mass, multiple Lymph nodes  Frozen; lymphoproliferative changes, ? Lymphoid variant thymic tumor v. lymphoma  Complications: no known  Gershon Crane PA-C

## 2011-09-30 NOTE — Progress Notes (Signed)
pcxr done as ordered 

## 2011-09-30 NOTE — Preoperative (Signed)
Beta Blockers   Reason not to administer Beta Blockers:Not Applicable 

## 2011-09-30 NOTE — Anesthesia Postprocedure Evaluation (Signed)
Anesthesia Post Note  Patient: Kevin Reese  Procedure(s) Performed:  VIDEO ASSISTED THORACOSCOPY (VATS)/WEDGE RESECTION  Anesthesia type: General  Patient location: PACU  Post pain: Pain level controlled and Adequate analgesia  Post assessment: Post-op Vital signs reviewed, Patient's Cardiovascular Status Stable, Respiratory Function Stable, Patent Airway and Pain level controlled  Last Vitals:  Filed Vitals:   09/30/11 1245  BP: 189/103  Pulse: 79  Temp:   Resp: 21    Post vital signs: Reviewed and stable  Level of consciousness: awake, alert  and oriented  Complications: No apparent anesthesia complications
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
## 2011-09-30 NOTE — Interval H&P Note (Signed)
History and Physical Interval Note:  09/30/2011 8:31 AM  Kevin Reese  has presented today for surgery, with the diagnosis of ANTERIOR MEDIASTINAL MASS  The various methods of treatment have been discussed with the patient and family. After consideration of risks, benefits and other options for treatment, the patient has consented to  Procedure(s): VIDEO ASSISTED THORACOSCOPY (VATS)/WEDGE RESECTION as a surgical intervention .  The patients' history has been reviewed, patient examined, no change in status, stable for surgery.  I have reviewed the patients' chart and labs.  Questions were answered to the patient's satisfaction.     Candi Profit C

## 2011-09-30 NOTE — Transfer of Care (Signed)
Immediate Anesthesia Transfer of Care Note  Patient: Kevin Reese  Procedure(s) Performed:  VIDEO ASSISTED THORACOSCOPY (VATS)/WEDGE RESECTION  Patient Location: PACU  Anesthesia Type: General  Level of Consciousness: sedated and responds to stimulation  Airway & Oxygen Therapy: Patient Spontanous Breathing and Patient connected to face mask oxygen  Post-op Assessment: Report given to PACU RN, Post -op Vital signs reviewed and stable and Patient moving all extremities X 4  Post vital signs: Reviewed and stable  Complications: No apparent anesthesia complications

## 2011-09-30 NOTE — Anesthesia Preprocedure Evaluation (Addendum)
Anesthesia Evaluation  Patient identified by MRN, date of birth, ID band Patient awake    Reviewed: Allergy & Precautions, H&P , NPO status , Patient's Chart, lab work & pertinent test results, reviewed documented beta blocker date and time   Airway Mallampati: III TM Distance: >3 FB Neck ROM: Full    Dental  (+) Teeth Intact and Dental Advisory Given   Pulmonary   Inspiratory wheezing  + wheezing      Cardiovascular hypertension, Pt. on home beta blockers + Valvular Problems/Murmurs Regular Normal+ Systolic murmurs No previous hx of HTN but on metoprolol this week took this am   Neuro/Psych PSYCHIATRIC DISORDERS Anxiety On Benzodiazepines Numbness and pain in Left leg from previous back surgeries, states he cannot feel his left foot      GI/Hepatic negative GI ROS, Neg liver ROS,   Endo/Other  Diabetes mellitus-Borderline Diabetic BG 114 11/30 0600  Renal/GU negative Renal ROS     Musculoskeletal negative musculoskeletal ROS (+)   Abdominal (+) obese,  Abdomen: soft. Bowel sounds: normal.  Peds  Hematology negative hematology ROS (+)   Anesthesia Other Findings   Reproductive/Obstetrics                           Anesthesia Physical Anesthesia Plan  ASA: III  Anesthesia Plan: General   Post-op Pain Management:    Induction: Intravenous  Airway Management Planned: Double Lumen EBT  Additional Equipment: Arterial line, CVP and Ultrasound Guidance Line Placement  Intra-op Plan:   Post-operative Plan: Possible Post-op intubation/ventilation and Extubation in OR  Informed Consent: I have reviewed the patients History and Physical, chart, labs and discussed the procedure including the risks, benefits and alternatives for the proposed anesthesia with the patient or authorized representative who has indicated his/her understanding and acceptance.   Dental advisory given  Plan Discussed  with: Anesthesiologist and Surgeon  Anesthesia Plan Comments:         Anesthesia Quick Evaluation

## 2011-10-01 ENCOUNTER — Inpatient Hospital Stay (HOSPITAL_COMMUNITY): Payer: Self-pay

## 2011-10-01 HISTORY — PX: MEDIASTINAL MASS EXCISION: SHX2025

## 2011-10-01 LAB — BASIC METABOLIC PANEL
BUN: 11 mg/dL (ref 6–23)
Calcium: 8.7 mg/dL (ref 8.4–10.5)
Creatinine, Ser: 0.94 mg/dL (ref 0.50–1.35)
GFR calc Af Amer: >90 mL/min (ref >90)
GFR calc non Af Amer: >90 mL/min (ref >90)
Glucose, Bld: 143 mg/dL — ABNORMAL HIGH (ref 70–99)
Potassium: 3.9 mEq/L (ref 3.5–5.1)

## 2011-10-01 LAB — BLOOD GAS, ARTERIAL
FIO2: 0.21 %
O2 Saturation: 95.3 %
Patient temperature: 98.6
pO2, Arterial: 67.4 mmHg — ABNORMAL LOW (ref 80.0–100.0)

## 2011-10-01 LAB — CBC
Hemoglobin: 12.4 g/dL — ABNORMAL LOW (ref 13.0–17.0)
MCHC: 34.2 g/dL (ref 30.0–36.0)
Platelets: 283 10*3/uL (ref 150–400)

## 2011-10-01 MED ORDER — SODIUM CHLORIDE 0.9 % IJ SOLN
INTRAMUSCULAR | Status: AC
  Administered 2011-10-01: 03:00:00
  Filled 2011-10-01: qty 10

## 2011-10-01 MED ORDER — LISINOPRIL 40 MG PO TABS
40.0000 mg | ORAL_TABLET | Freq: Every day | ORAL | Status: DC
  Administered 2011-10-01 – 2011-10-03 (×3): 40 mg via ORAL
  Filled 2011-10-01 (×3): qty 1

## 2011-10-01 MED ORDER — METOPROLOL TARTRATE 50 MG PO TABS
50.0000 mg | ORAL_TABLET | Freq: Two times a day (BID) | ORAL | Status: DC
  Administered 2011-10-01 – 2011-10-03 (×4): 50 mg via ORAL
  Filled 2011-10-01 (×5): qty 1

## 2011-10-01 NOTE — Progress Notes (Addendum)
1 Day Post-Op Procedure(s) (LRB): VIDEO ASSISTED THORACOSCOPY (VATS)/WEDGE RESECTION (Left)  Subjective: Patient with some incisional pain and occasional nausea. Denies abdominal pain or emesis.  Objective: Vital signs in last 24 hours: Patient Vitals for the past 24 hrs: Temp 97.6, HR 102,RR 18, BP 182/101, O2 sat 94%   Current Weight  09/30/11 261 lb 14.5 oz (118.8 kg)      Intake/Output from previous day: 11/30 0701 - 12/01 0700 In: 5120 [P.O.:720; I.V.:3700; IV Piggyback:700] Out: 5610 [Urine:4150; Blood:1000; Chest Tube:460]   Physical Exam:  Cardiovascular: RRR Pulmonary: Decreased at bases L > R; no rales, wheezes, or rhonchi. Abdomen: Soft, non tender, bowel sounds present. Extremities: Trace bilateral lower extremity edema. Wounds: Dressings clean and dry.    Lab Results: CBC: Basename 10/01/11 0300 09/30/11 1023 09/28/11 1231  WBC 12.5* -- 8.9  HGB 12.4* 12.2* --  HCT 36.3* 36.0* --  PLT 283 -- 334   BMET:  Basename 10/01/11 0300 09/30/11 1023 09/28/11 1231  NA 135 139 --  K 3.9 3.9 --  CL 101 -- 102  CO2 25 -- 23  GLUCOSE 143* -- 119*  BUN 11 -- 12  CREATININE 0.94 -- 1.01  CALCIUM 8.7 -- 9.9    PT/INR:  Basename 09/28/11 1231  LABPROT 13.2  INR 0.98   ABG:  INR: Will add last result for INR, ABG once components are confirmed Will add last 4 CBG results once components are confirmed  Assessment/Plan:  1.Mild ABL anemia-H/H 12.4/36.3. 2.Pulmonary-CXR shows cardiomegaly, some pulmonary vascular congestion,  NO pneumothorax, and a small amount of subcutaneous emphysema.Chest tube on suction and without an air leak.   Place to water seal soon. Check CXR in am.Pathology pending.  3.Hypertension-Patient already restarted on Lopressor 25 bid.  According to his record, he was on Azor awhile ago, but stopped taking it as he could not afford it.  Will start Lisinopril 40 daily. Continue Hydralazine PRN. 4.Remove A line. 5.Decrease IVF as patient  tolerating a diet.    Ardelle Balls, PA 10/01/2011   I have seen and examined the patient and agree with the assessment and plan as outlined.  Increase beta blocker.  Chest tube draining serous fluid, will likely d/c tube tomorrow.  OWEN,CLARENCE H 10/01/2011 1:48 PM
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
## 2011-10-02 ENCOUNTER — Inpatient Hospital Stay (HOSPITAL_COMMUNITY): Payer: Self-pay

## 2011-10-02 LAB — CBC
HCT: 36 % — ABNORMAL LOW (ref 39.0–52.0)
Hemoglobin: 12 g/dL — ABNORMAL LOW (ref 13.0–17.0)
WBC: 11.8 10*3/uL — ABNORMAL HIGH (ref 4.0–10.5)

## 2011-10-02 LAB — COMPREHENSIVE METABOLIC PANEL
Albumin: 3.1 g/dL — ABNORMAL LOW (ref 3.5–5.2)
Alkaline Phosphatase: 45 U/L (ref 39–117)
BUN: 7 mg/dL (ref 6–23)
Chloride: 98 mEq/L (ref 96–112)
GFR calc Af Amer: >90 mL/min (ref >90)
Glucose, Bld: 134 mg/dL — ABNORMAL HIGH (ref 70–99)
Potassium: 3.2 mEq/L — ABNORMAL LOW (ref 3.5–5.1)
Total Bilirubin: 0.5 mg/dL (ref 0.3–1.2)

## 2011-10-02 MED ORDER — SODIUM CHLORIDE 0.9 % IJ SOLN
INTRAMUSCULAR | Status: AC
  Filled 2011-10-02: qty 10

## 2011-10-02 MED ORDER — LISINOPRIL 40 MG PO TABS: 40.0000 mg | ORAL_TABLET | Freq: Every day | ORAL | Status: DC

## 2011-10-02 MED ORDER — METOPROLOL TARTRATE 25 MG PO TABS: 50.0000 mg | ORAL_TABLET | Freq: Two times a day (BID) | ORAL | Status: DC

## 2011-10-02 MED ORDER — GUAIFENESIN ER 600 MG PO TB12
600.0000 mg | ORAL_TABLET | Freq: Two times a day (BID) | ORAL | Status: DC
  Administered 2011-10-02 – 2011-10-03 (×2): 600 mg via ORAL
  Filled 2011-10-02 (×4): qty 1

## 2011-10-02 MED ORDER — SODIUM CHLORIDE 0.9 % IJ SOLN
INTRAMUSCULAR | Status: AC
  Administered 2011-10-02: 04:00:00 via INTRAVENOUS
  Filled 2011-10-02: qty 20

## 2011-10-02 MED ORDER — GUAIFENESIN ER 600 MG PO TB12: 600.0000 mg | ORAL_TABLET | Freq: Two times a day (BID) | ORAL | Status: DC

## 2011-10-02 MED ORDER — OXYCODONE-ACETAMINOPHEN 5-325 MG PO TABS: 1.0000 | ORAL_TABLET | ORAL | Status: AC | PRN

## 2011-10-02 NOTE — Progress Notes (Signed)
Left Flank CT d/c'd per MD order, per policy, patient tolerated well.  STAT portable chest-xray done, patient tolerated well, will continue to monitor.

## 2011-10-02 NOTE — Progress Notes (Addendum)
2 Days Post-Op Procedure(s) (LRB): VIDEO ASSISTED THORACOSCOPY (VATS)/WEDGE RESECTION (Left)  Subjective:  Patient with incisional pain, productive cough.  Objective: Vital signs in last 24 hours: Patient Vitals for the past 24 hrs: Temp 97.6, HR 102,RR 18, BP 182/101, O2 sat 94%   Current Weight  09/30/11 261 lb 14.5 oz (118.8 kg)      Intake/Output from previous day: 12/01 0701 - 12/02 0700 In: 845 [I.V.:745; IV Piggyback:100] Out: 3475 [Urine:3375; Chest Tube:100]   Physical Exam:  Cardiovascular: RRR Pulmonary: Decreased at bases L > R; no rales, wheezes, or rhonchi. Abdomen: Soft, non tender, bowel sounds present. Extremities: Trace bilateral lower extremity edema. Wounds: Dressings clean and dry.    Lab Results: CBC:  Basename 10/02/11 0400 10/01/11 0300  WBC 11.8* 12.5*  HGB 12.0* 12.4*  HCT 36.0* 36.3*  PLT 275 283   BMET:   Basename 10/02/11 0400 10/01/11 0300  NA 133* 135  K 3.2* 3.9  CL 98 101  CO2 26 25  GLUCOSE 134* 143*  BUN 7 11  CREATININE 0.89 0.94  CALCIUM 8.7 8.7    PT/INR: No results found for this basename: LABPROT,INR in the last 72 hours ABG:  INR: Will add last result for INR, ABG once components are confirmed Will add last 4 CBG results once components are confirmed  Assessment/Plan:  1.Mild ABL anemia-H/H 12.4/36.3. 2.Pulmonary-CXR this am shows questionable small left ptx, stable subcutaneous emphysema. Chest tube does not have an air leak. Remove chest tube today.Pathology pending.  3.Hypertension-Blood pressure better controlled with Lopressor 50 bid and Lisinopril 40 daily. 4.Mucinex for cough. 5.Possible discharge in am.    Ardelle Balls, PA 10/02/2011    ZIMMERMAN,DONIELLE M 10/02/2011 10:23 AM   I have seen and examined the patient and agree with the assessment and plan as outlined.  Eve Rey H 10/02/2011 1:58 PM
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
## 2011-10-03 ENCOUNTER — Inpatient Hospital Stay (HOSPITAL_COMMUNITY): Payer: Self-pay

## 2011-10-03 LAB — TYPE AND SCREEN
ABO/RH(D): A POS
Antibody Screen: NEGATIVE
Unit division: 0

## 2011-10-03 MED ORDER — POTASSIUM CHLORIDE CRYS ER 20 MEQ PO TBCR
40.0000 meq | EXTENDED_RELEASE_TABLET | Freq: Once | ORAL | Status: AC
  Administered 2011-10-03: 40 meq via ORAL
  Filled 2011-10-03: qty 2

## 2011-10-03 NOTE — Progress Notes (Addendum)
3 Days Post-Op  Procedure(s) (LRB): VIDEO ASSISTED THORACOSCOPY (VATS)/WEDGE RESECTION (Left) Subjective:  Less pain, + BM  Objective  Telemetry NSR  Temp:  [97.7 F (36.5 C)-98.7 F (37.1 C)] 98 F (36.7 C) (12/03 0400) Pulse Rate:  [84-120] 92  (12/03 0400) Resp:  [16-35] 35  (12/02 2000) BP: (136-162)/(75-95) 142/95 mmHg (12/03 0400) SpO2:  [94 %-98 %] 96 % (12/03 0400)   Intake/Output Summary (Last 24 hours) at 10/03/11 0731 Last data filed at 10/02/11 1600  Gross per 24 hour  Intake   1500 ml  Output     70 ml  Net   1430 ml       General appearance: alert, cooperative and no distress Heart: regular rate and rhythm, S1, S2 normal, no murmur, click, rub or gallop Lungs: mildly diminished in the bases Abdomen: soft, non-tender; bowel sounds normal; no masses,  no organomegaly Wound: incision healing without signs of infection  Lab Results:  Basename 10/02/11 0400 10/01/11 0300  NA 133* 135  K 3.2* 3.9  CL 98 101  CO2 26 25  GLUCOSE 134* 143*  BUN 7 11  CREATININE 0.89 0.94  CALCIUM 8.7 8.7  MG -- --  PHOS -- --    Basename 10/02/11 0400  AST 17  ALT 11  ALKPHOS 45  BILITOT 0.5  PROT 6.2  ALBUMIN 3.1*   No results found for this basename: LIPASE:2,AMYLASE:2 in the last 72 hours  Basename 10/02/11 0400 10/01/11 0300  WBC 11.8* 12.5*  NEUTROABS -- --  HGB 12.0* 12.4*  HCT 36.0* 36.3*  MCV 87.6 85.8  PLT 275 283   No results found for this basename: CKTOTAL:4,CKMB:4,TROPONINI:4 in the last 72 hours No results found for this basename: POCBNP:3 in the last 72 hours No results found for this basename: DDIMER in the last 72 hours No results found for this basename: HGBA1C in the last 72 hours No results found for this basename: CHOL,HDL,LDLCALC,TRIG,CHOLHDL in the last 72 hours No results found for this basename: TSH,T4TOTAL,FREET3,T3FREE,THYROIDAB in the last 72 hours No results found for this basename:  VITAMINB12,FOLATE,FERRITIN,TIBC,IRON,RETICCTPCT in the last 72 hours  Medications: Scheduled    . albuterol  2 puff Inhalation Q6H  . bisacodyl  10 mg Oral Daily  . enoxaparin (LOVENOX) injection  40 mg Subcutaneous Q24H  . guaiFENesin  600 mg Oral BID  . lisinopril  40 mg Oral Daily  . metoprolol tartrate  50 mg Oral BID  . sodium chloride      . DISCONTD: fentaNYL   Intravenous Q4H     Radiology/Studies:  Dg Chest Port 1 View  10/02/2011  *RADIOLOGY REPORT*  Clinical Data: Chest tube removal.  PORTABLE CHEST - 1 VIEW  Comparison: 10/02/2011  Findings: The left chest tube has been removed.  Again noted is a very small left pneumothorax.  The pneumothorax has not changed. Stable patchy densities in the left lower lung.  Jugular central venous catheter in a stable position.  Right lung is clear.  IMPRESSION: Removal of left chest tube.  Stable tiny left pneumothorax.  Stable patchy densities in the left lower lung.  Original Report Authenticated By: Richarda Overlie, M.D.   Dg Chest Port 1 View  10/02/2011  *RADIOLOGY REPORT*  Clinical Data: Evaluate chest tube.  PORTABLE CHEST - 1 VIEW  Comparison:  10/01/2011  Findings: Single view chest demonstrates a left chest tube.  Chest tube position is unchanged.  Stable airspace densities in the left midlung.  There may be a  tiny left pneumothorax.  Small amount of subcutaneous gas in the left hemithorax.  Right lung is clear. Right jugular central venous catheter in stable position.  IMPRESSION: Stable position of left chest tube and a tiny pneumothorax.  Stable parenchymal or airspace densities in the left midlung.  Original Report Authenticated By: Richarda Overlie, M.D.    INR: Will add last result for INR, ABG once components are confirmed Will add last 4 CBG results once components are confirmed  Assessment/Plan: S/P Procedure(s) (LRB): VIDEO ASSISTED THORACOSCOPY (VATS)/WEDGE RESECTION (Left) 1. Bp remains elevated, will cont Lisinopril and B Blocker  at current dose. He is arranging outpatient follow-up for BP and understands importance of good BP control. 2. Replace K+ 3. Appears stable for D/C   LOS: 3 days    Kevin Reese,Kevin Reese 12/3/20127:31 AM   D/C home  Will f/u path Will see back in 2 weeks Will f/u with Dr. Myna Hidalgo as well.

## 2011-10-03 NOTE — Progress Notes (Signed)
  Discussed discharge instructions and medications with patient and family member. Answered all questions. Vss. Pt has no complaints of pain. Surgical site is WNL. All IV's are dc'ed with tips intact. Pt discharged home with family.  Kevin Reese Annette 12/3/201211:33 AM

## 2011-10-03 NOTE — Plan of Care (Signed)
Problem: Discharge Progression Outcomes Goal: Pneumonia & Flu vaccines given if indicated Outcome: Not Applicable Date Met:  10/03/11 Patient refused

## 2011-10-04 LAB — GLUCOSE, CAPILLARY: Glucose-Capillary: 114 mg/dL — ABNORMAL HIGH (ref 70–99)

## 2011-10-05 ENCOUNTER — Encounter: Payer: Self-pay | Admitting: Hematology & Oncology

## 2011-10-05 ENCOUNTER — Telehealth: Payer: Self-pay | Admitting: *Deleted

## 2011-10-05 ENCOUNTER — Other Ambulatory Visit: Payer: Self-pay | Admitting: Hematology & Oncology

## 2011-10-05 DIAGNOSIS — C8190 Hodgkin lymphoma, unspecified, unspecified site: Secondary | ICD-10-CM

## 2011-10-05 NOTE — Progress Notes (Signed)
Mailed EPP to patient's home address. °
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
## 2011-10-07 ENCOUNTER — Ambulatory Visit: Payer: Self-pay

## 2011-10-07 VITALS — BP 124/89 | HR 78 | Resp 18

## 2011-10-07 DIAGNOSIS — Z4802 Encounter for removal of sutures: Secondary | ICD-10-CM

## 2011-10-07 DIAGNOSIS — J9859 Other diseases of mediastinum, not elsewhere classified: Secondary | ICD-10-CM

## 2011-10-07 DIAGNOSIS — Z9889 Other specified postprocedural states: Secondary | ICD-10-CM

## 2011-10-07 NOTE — Progress Notes (Signed)
Removed 2 sutures from chest tube sites. No signs of infection. Patient tolerated well.

## 2011-10-10 ENCOUNTER — Ambulatory Visit (HOSPITAL_COMMUNITY): Payer: Self-pay | Attending: Hematology & Oncology | Admitting: Radiology

## 2011-10-10 DIAGNOSIS — C8190 Hodgkin lymphoma, unspecified, unspecified site: Secondary | ICD-10-CM

## 2011-10-10 DIAGNOSIS — F17200 Nicotine dependence, unspecified, uncomplicated: Secondary | ICD-10-CM | POA: Insufficient documentation

## 2011-10-10 DIAGNOSIS — I10 Essential (primary) hypertension: Secondary | ICD-10-CM | POA: Insufficient documentation

## 2011-10-10 DIAGNOSIS — Z5111 Encounter for antineoplastic chemotherapy: Secondary | ICD-10-CM

## 2011-10-10 DIAGNOSIS — R011 Cardiac murmur, unspecified: Secondary | ICD-10-CM | POA: Insufficient documentation

## 2011-10-10 DIAGNOSIS — R222 Localized swelling, mass and lump, trunk: Secondary | ICD-10-CM | POA: Insufficient documentation

## 2011-10-10 MED ORDER — TECHNETIUM TC 99M-LABELED RED BLOOD CELLS IV KIT
33.0000 | PACK | Freq: Once | INTRAVENOUS | Status: AC | PRN
  Administered 2011-10-10: 33 via INTRAVENOUS

## 2011-10-10 NOTE — Progress Notes (Signed)
Muga Study  Indication:  Assess LV Function  History: 09/03/11 Chest CT-4.8 cm mass, H/O murmur Cardiac Risk Factors: HTN, Smoker  18g IV est. 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   Muga Information:  The patient's red blood cells were labeled using the Ultra Tag method with 33.0 mci of Technetium 31m Pertechnetate.  The images were reconstructed in the Anterior, Lateral and Left Anterior oblique views.  Impression: LVEF 52%.

## 2011-10-11 ENCOUNTER — Other Ambulatory Visit (HOSPITAL_COMMUNITY): Payer: Self-pay | Admitting: *Deleted

## 2011-10-11 ENCOUNTER — Encounter (HOSPITAL_COMMUNITY): Payer: Self-pay | Admitting: Pharmacy Technician

## 2011-10-11 ENCOUNTER — Other Ambulatory Visit (HOSPITAL_COMMUNITY): Payer: Self-pay | Admitting: Physician Assistant

## 2011-10-11 NOTE — Progress Notes (Signed)
Copy of muga study routed to Dr. Tarri Abernethy

## 2011-10-12 ENCOUNTER — Ambulatory Visit (HOSPITAL_COMMUNITY)
Admission: RE | Admit: 2011-10-12 | Discharge: 2011-10-12 | Disposition: A | Discharge status: Home / Self Care | Payer: Self-pay | Source: Ambulatory Visit | Attending: Hematology & Oncology | Admitting: Hematology & Oncology

## 2011-10-12 ENCOUNTER — Encounter (HOSPITAL_COMMUNITY): Payer: Self-pay

## 2011-10-12 DIAGNOSIS — D649 Anemia, unspecified: Secondary | ICD-10-CM | POA: Insufficient documentation

## 2011-10-12 DIAGNOSIS — Z79899 Other long term (current) drug therapy: Secondary | ICD-10-CM | POA: Insufficient documentation

## 2011-10-12 DIAGNOSIS — E119 Type 2 diabetes mellitus without complications: Secondary | ICD-10-CM | POA: Insufficient documentation

## 2011-10-12 DIAGNOSIS — C8190 Hodgkin lymphoma, unspecified, unspecified site: Secondary | ICD-10-CM | POA: Insufficient documentation

## 2011-10-12 DIAGNOSIS — I10 Essential (primary) hypertension: Secondary | ICD-10-CM | POA: Insufficient documentation

## 2011-10-12 DIAGNOSIS — D473 Essential (hemorrhagic) thrombocythemia: Secondary | ICD-10-CM | POA: Insufficient documentation

## 2011-10-12 LAB — DIFFERENTIAL
Eosinophils Relative: 4 % (ref 0–5)
Lymphocytes Relative: 32 % (ref 12–46)
Lymphs Abs: 2.5 10*3/uL (ref 0.7–4.0)
Neutrophils Relative %: 55 % (ref 43–77)

## 2011-10-12 LAB — CBC
Hemoglobin: 12.6 g/dL — ABNORMAL LOW (ref 13.0–17.0)
MCV: 86.3 fL (ref 78.0–100.0)
Platelets: 427 10*3/uL — ABNORMAL HIGH (ref 150–400)
RBC: 4.45 MIL/uL (ref 4.22–5.81)
WBC: 7.8 10*3/uL (ref 4.0–10.5)

## 2011-10-12 MED ORDER — SODIUM CHLORIDE 0.9 % IV SOLN: INTRAVENOUS | Status: DC

## 2011-10-12 MED ORDER — MIDAZOLAM HCL 5 MG/5ML IJ SOLN
INTRAMUSCULAR | Status: AC | PRN
  Administered 2011-10-12 (×2): 2 mg via INTRAVENOUS

## 2011-10-12 MED ORDER — FENTANYL CITRATE 0.05 MG/ML IJ SOLN
INTRAMUSCULAR | Status: AC | PRN
  Administered 2011-10-12: 100 ug via INTRAVENOUS

## 2011-10-12 NOTE — H&P (Signed)
Kevin Reese is an 30 y.o. male.   Chief Complaint: " I'm here for a bone marrow biopsy" HPI: Patient with history of Hodgkin's lymphoma presents today for elective CT guided bone marrow biopsy.  Past Medical History  Diagnosis Date  . Mediastinal mass   . Hypertension     was on Azor and Amlodipine/Benazepril;was started on these in 2009;came off of these meds a yr ago bc can't afford them  . Heart murmur     diagnosed in high school  . Chronic back pain     hx buldging disc  . Diabetes mellitus     "borderline" diabetic  . Anxiety     takes Klonopin daily    Past Surgical History  Procedure Date  . Back surgery 2009/2011  . Tumor removal 10/2011    Family History  Problem Relation Age of Onset  . Anesthesia problems Neg Hx   . Hypotension Neg Hx   . Malignant hyperthermia Neg Hx   . Pseudochol deficiency Neg Hx    Social History:  reports that he has been smoking Cigarettes.  He has a 6 pack-year smoking history. He has never used smokeless tobacco. He reports that he drinks about 3.6 ounces of alcohol per week. He reports that he uses illicit drugs (Marijuana) about 3 times per week.  Allergies:  Allergies  Allergen Reactions  . Penicillins Rash and Other (See Comments)    Allergic since childhood    Medications Prior to Admission  Medication Sig Dispense Refill  . clonazePAM (KLONOPIN) 2 MG tablet Take 2 mg by mouth 4 (four) times daily as needed. For anxiety      . guaiFENesin (MUCINEX) 600 MG 12 hr tablet Take 1 tablet (600 mg total) by mouth 2 (two) times daily. For cough.      Marland Kitchen lisinopril (PRINIVIL,ZESTRIL) 40 MG tablet Take 40 mg by mouth every morning.        . metoprolol tartrate (LOPRESSOR) 25 MG tablet Take 25 mg by mouth 2 (two) times daily.        Marland Kitchen oxyCODONE-acetaminophen (PERCOCET) 5-325 MG per tablet Take 1-2 tablets by mouth every 4 (four) hours as needed for pain.  50 tablet  0   No current facility-administered medications on file as of  10/12/2011.    No results found for this or any previous visit (from the past 48 hour(s)). No results found.  Review of Systems  Constitutional: Positive for diaphoresis. Negative for fever and chills.  Respiratory: Positive for wheezing. Negative for cough and shortness of breath.   Cardiovascular: Positive for chest pain.       Mild left anterior chest discomfort (hx of mediastinal mass resection-lymphoma)  Gastrointestinal: Negative for nausea, vomiting and abdominal pain.  Musculoskeletal: Positive for back pain.       Left leg pain and numbness  Neurological: Negative for headaches.    Blood pressure 127/69, pulse 76, temperature 98.6 F (37 C), temperature source Oral, resp. rate 16, height 5\' 10"  (1.778 m), weight 260 lb (117.935 kg), SpO2 98.00%. Physical Exam  Constitutional: He is oriented to person, place, and time. He appears well-developed and well-nourished.  Cardiovascular: Normal rate and regular rhythm.   Murmur heard. Respiratory: Effort normal and breath sounds normal.       Mildly diminished BS at bases  GI: Soft. Bowel sounds are normal. There is no tenderness.  Musculoskeletal: Normal range of motion. He exhibits no edema.  Neurological: He is alert and oriented to person,  place, and time.     Assessment/Plan Patient with history of Hodgkin's lymphoma; plan is for CT guided bone marrow biopsy.  ALLRED,D KEVIN 10/12/2011, 8:28 AM

## 2011-10-12 NOTE — Procedures (Signed)
R iliac BM Bx

## 2011-10-12 NOTE — ED Notes (Signed)
Patient is resting comfortably. 

## 2011-10-14 ENCOUNTER — Telehealth: Payer: Self-pay | Admitting: *Deleted

## 2011-10-14 ENCOUNTER — Other Ambulatory Visit: Payer: Self-pay | Admitting: Hematology & Oncology

## 2011-10-14 ENCOUNTER — Encounter: Payer: Self-pay | Admitting: Hematology & Oncology

## 2011-10-14 DIAGNOSIS — C8192 Hodgkin lymphoma, unspecified, intrathoracic lymph nodes: Secondary | ICD-10-CM | POA: Insufficient documentation

## 2011-10-14 HISTORY — DX: Hodgkin lymphoma, unspecified, intrathoracic lymph nodes: C81.92

## 2011-10-14 NOTE — Telephone Encounter (Signed)
Talked to Meridith in CarioPulmonary at [facility] and [place] in IR. Pt aware of 1-3 at [facility] for PFT at 945 am. He is aware Inetta Fermo will call him for port placement for 1-4. He is aware of 1-7 and 1-21 appointments and knows to call if have any questions

## 2011-10-14 NOTE — Telephone Encounter (Signed)
Talked to Kevin Reese at IR she will schedule pt for port placement 1-4 and let pt know. I left Bonita Quin note with port PFT's and chemo to pre cert

## 2011-10-17 ENCOUNTER — Other Ambulatory Visit: Payer: Self-pay | Admitting: Thoracic Surgery (Cardiothoracic Vascular Surgery)

## 2011-10-17 ENCOUNTER — Encounter (HOSPITAL_COMMUNITY): Payer: Self-pay | Admitting: Pharmacy Technician

## 2011-10-17 DIAGNOSIS — R222 Localized swelling, mass and lump, trunk: Secondary | ICD-10-CM

## 2011-10-19 ENCOUNTER — Encounter: Payer: Self-pay | Admitting: Thoracic Surgery (Cardiothoracic Vascular Surgery)

## 2011-10-19 ENCOUNTER — Ambulatory Visit
Admission: RE | Admit: 2011-10-19 | Discharge: 2011-10-19 | Disposition: A | Discharge status: Home / Self Care | Payer: No Typology Code available for payment source | Source: Ambulatory Visit | Attending: Thoracic Surgery (Cardiothoracic Vascular Surgery) | Admitting: Thoracic Surgery (Cardiothoracic Vascular Surgery)

## 2011-10-19 ENCOUNTER — Ambulatory Visit (INDEPENDENT_AMBULATORY_CARE_PROVIDER_SITE_OTHER): Payer: Self-pay | Admitting: Thoracic Surgery (Cardiothoracic Vascular Surgery)

## 2011-10-19 ENCOUNTER — Other Ambulatory Visit: Payer: Self-pay | Admitting: Radiology

## 2011-10-19 VITALS — BP 123/68 | HR 78 | Resp 18 | Ht 71.0 in | Wt 245.0 lb

## 2011-10-19 DIAGNOSIS — R222 Localized swelling, mass and lump, trunk: Secondary | ICD-10-CM

## 2011-10-19 DIAGNOSIS — Z9889 Other specified postprocedural states: Secondary | ICD-10-CM

## 2011-10-19 DIAGNOSIS — C8192 Hodgkin lymphoma, unspecified, intrathoracic lymph nodes: Secondary | ICD-10-CM

## 2011-10-19 NOTE — Progress Notes (Signed)
  HPI:  Kevin Reese returns for followup. He had a left VATS on November 30 for an anterior mediastinal mass. The mass and as some AP window nodes were resected. It turned out to be Hodgkin's disease. He had an uncomplicated postoperative course and now returns for a scheduled followup visit.  He complains of some incisional pain staking about 2-3 pain pills a day he also has some numbness and tingly sensation around his left nipple.  Current Outpatient Prescriptions  Medication Sig Dispense Refill  . clonazePAM (KLONOPIN) 2 MG tablet Take 2 mg by mouth 4 (four) times daily as needed. For anxiety      . guaiFENesin (MUCINEX) 600 MG 12 hr tablet Take 1 tablet (600 mg total) by mouth 2 (two) times daily. For cough.      Marland Kitchen lisinopril (PRINIVIL,ZESTRIL) 40 MG tablet Take 40 mg by mouth every morning.        . metoprolol tartrate (LOPRESSOR) 25 MG tablet Take 25 mg by mouth 2 (two) times daily.          Physical Exam BP 123/68  Pulse 78  Resp 18  Ht 5\' 11"  (1.803 m)  Wt 245 lb (111.131 kg)  BMI 34.17 kg/m2  SpO2 98% Lungs clear and equal bilaterally Incisions healing well  Diagnostic Tests: Chest x-ray shows a persistent left base atelectasis  Impression: 30 year old with anterior mediastinal mass and adenopathy, diagnosed with Hodgkin's disease. He is doing well postoperatively. He does still have some discomfort, I gave a prescription for an additional 30 oxycodone tablets, 1-2 tablets 3 times daily as needed for pain. His activities at this point are unrestricted, but advised him keep his activities relatively life the next couple weeks, until the pain subsides.  Plan: He will begin treatment for Hodgkin's disease on 11/07/2011. There are no surgical issues at this time.  I would be happy to see him back any time in the future that can be of any further assistance with his care.

## 2011-10-21 ENCOUNTER — Other Ambulatory Visit: Payer: Self-pay | Admitting: Hematology & Oncology

## 2011-10-21 ENCOUNTER — Inpatient Hospital Stay (HOSPITAL_COMMUNITY): Admission: RE | Admit: 2011-10-21 | Payer: Self-pay | Source: Ambulatory Visit

## 2011-10-21 ENCOUNTER — Ambulatory Visit (HOSPITAL_COMMUNITY)
Admission: RE | Admit: 2011-10-21 | Discharge: 2011-10-21 | Disposition: A | Discharge status: Home / Self Care | Payer: Self-pay | Source: Ambulatory Visit | Attending: Hematology & Oncology | Admitting: Hematology & Oncology

## 2011-10-21 DIAGNOSIS — C8192 Hodgkin lymphoma, unspecified, intrathoracic lymph nodes: Secondary | ICD-10-CM

## 2011-10-21 DIAGNOSIS — C8190 Hodgkin lymphoma, unspecified, unspecified site: Secondary | ICD-10-CM | POA: Insufficient documentation

## 2011-10-21 LAB — BASIC METABOLIC PANEL
BUN: 22 mg/dL (ref 6–23)
CO2: 26 mEq/L (ref 19–32)
Chloride: 99 mEq/L (ref 96–112)
GFR calc non Af Amer: 82 mL/min — ABNORMAL LOW (ref >90)
Glucose, Bld: 110 mg/dL — ABNORMAL HIGH (ref 70–99)
Potassium: 5.1 mEq/L (ref 3.5–5.1)
Sodium: 133 mEq/L — ABNORMAL LOW (ref 135–145)

## 2011-10-21 LAB — CBC
HCT: 40.1 % (ref 39.0–52.0)
Hemoglobin: 13.6 g/dL (ref 13.0–17.0)
MCH: 28.8 pg (ref 26.0–34.0)
MCHC: 33.9 g/dL (ref 30.0–36.0)
MCV: 84.8 fL (ref 78.0–100.0)
RBC: 4.73 MIL/uL (ref 4.22–5.81)

## 2011-10-21 LAB — PROTIME-INR: Prothrombin Time: 13.4 seconds (ref 11.6–15.2)

## 2011-10-21 MED ORDER — MIDAZOLAM HCL 5 MG/5ML IJ SOLN
INTRAMUSCULAR | Status: AC | PRN
  Administered 2011-10-21: 2 mg via INTRAVENOUS

## 2011-10-21 MED ORDER — HEPARIN SOD (PORK) LOCK FLUSH 100 UNIT/ML IV SOLN
500.0000 [IU] | Freq: Once | INTRAVENOUS | Status: AC
  Administered 2011-10-21: 500 [IU] via INTRAVENOUS

## 2011-10-21 MED ORDER — LIDOCAINE-EPINEPHRINE 2 %-1:100000 IJ SOLN
INTRAMUSCULAR | Status: AC
  Filled 2011-10-21: qty 1

## 2011-10-21 MED ORDER — VANCOMYCIN HCL IN DEXTROSE 1-5 GM/200ML-% IV SOLN
1000.0000 mg | INTRAVENOUS | Status: AC
  Administered 2011-10-21: 1000 mg via INTRAVENOUS
  Filled 2011-10-21: qty 200

## 2011-10-21 MED ORDER — SODIUM CHLORIDE 0.9 % IV SOLN: INTRAVENOUS | Status: DC

## 2011-10-21 MED ORDER — FENTANYL CITRATE 0.05 MG/ML IJ SOLN
INTRAMUSCULAR | Status: AC | PRN
  Administered 2011-10-21 (×2): 100 ug via INTRAVENOUS

## 2011-10-21 NOTE — H&P (Signed)
Kevin Reese is an 30 y.o. male.   Chief Complaint: Here for portacayh placement HPI: 30 yo male with history of Hodgkin's Lymphoma and mediastinal mass here for portacath placement for chemotherapy. Had mediastinal mass excision several weeks ago.  Past Medical History  Diagnosis Date  . Mediastinal mass   . Hypertension     was on Azor and Amlodipine/Benazepril;was started on these in 2009;came off of these meds a yr ago bc can't afford them  . Heart murmur     diagnosed in high school  . Chronic back pain     hx buldging disc  . Diabetes mellitus     "borderline" diabetic  . Anxiety     takes Klonopin daily  . Hodgkin lymphoma of intrathoracic lymph nodes 10/14/2011    Past Surgical History  Procedure Date  . Back surgery 2009/2011  . Mediastinal mass excision 10/2011    Family History  Problem Relation Age of Onset  . Anesthesia problems Neg Hx   . Hypotension Neg Hx   . Malignant hyperthermia Neg Hx   . Pseudochol deficiency Neg Hx    Social History:  reports that he has been smoking Cigarettes.  He has a 6 pack-year smoking history. He has never used smokeless tobacco. He reports that he drinks about 3.6 ounces of alcohol per week. He reports that he uses illicit drugs (Marijuana) about 3 times per week.  Allergies:  Allergies  Allergen Reactions  . Penicillins Rash and Other (See Comments)    Allergic since childhood    Medications Prior to Admission  Medication Sig Dispense Refill  . clonazePAM (KLONOPIN) 2 MG tablet Take 2 mg by mouth 4 (four) times daily as needed. For anxiety      . guaiFENesin (MUCINEX) 600 MG 12 hr tablet Take 1 tablet (600 mg total) by mouth 2 (two) times daily. For cough.      Marland Kitchen lisinopril (PRINIVIL,ZESTRIL) 40 MG tablet Take 40 mg by mouth every morning.        . metoprolol tartrate (LOPRESSOR) 25 MG tablet Take 25 mg by mouth 2 (two) times daily.         Medications Prior to Admission  Medication Dose Route Frequency Provider  Last Rate Last Dose  . 0.9 %  sodium chloride infusion   Intravenous Continuous Kevin Leu, PA      . vancomycin (VANCOCIN) IVPB 1000 mg/200 mL premix  1,000 mg Intravenous to XRAY Kevin Leu, PA        No results found for this or any previous visit (from the past 48 hour(s)). No results found.  Review of Systems  Constitutional: Negative for fever.  Respiratory: Positive for cough, sputum production and shortness of breath.   Cardiovascular: Positive for chest pain.  Endo/Heme/Allergies: Does not bruise/bleed easily.    There were no vitals taken for this visit. Physical Exam Airway - 1 Heart - RRR Lungs - clear Abd - soft NT  Assessment/Plan Portacath placement today Dr Kevin Reese - for chemotherapy. Informed consent obtained.  Kevin Reese 10/21/2011, 1:15 PM

## 2011-10-21 NOTE — ED Notes (Signed)
Patient denies pain and is resting comfortably.  

## 2011-10-21 NOTE — ED Notes (Signed)
Patient is resting comfortably. 

## 2011-10-21 NOTE — Procedures (Signed)
Successful placement of right jugular approach port-a-cath with tip at superior aspect of the right atrium. The catheter is ready for immediate use. No immediate post procedural complications.

## 2011-10-27 ENCOUNTER — Encounter: Payer: Self-pay | Admitting: Hematology & Oncology

## 2011-10-27 NOTE — Progress Notes (Signed)
Patient to begin chemotherapy on 11/07/2011.  Per [facility], no chemo replacement available for Adriamycin, vinblastine, bleomycin or dacarbazine.  Patient stated he is waiting for his financial application to be processed through [facility] Patient Accounting.  I left message on 10/27/11 for a patient accounting representative to call me regarding the status of his application.

## 2011-10-27 NOTE — Progress Notes (Signed)
Called patient today to inquire about [facility] financial application (I mailed application to his home address on 10-05-11).  Patient stated he had already filled out an application and that Jamas Lav in Patient Accounting was helping him with this.  I called Patient Accounting [phone] amd left message to have a rep call me with the status of this application.
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
## 2011-11-03 ENCOUNTER — Ambulatory Visit (HOSPITAL_COMMUNITY)
Admission: RE | Admit: 2011-11-03 | Discharge: 2011-11-03 | Disposition: A | Discharge status: Home / Self Care | Payer: Self-pay | Source: Ambulatory Visit | Attending: Hematology & Oncology | Admitting: Hematology & Oncology

## 2011-11-03 DIAGNOSIS — C8192 Hodgkin lymphoma, unspecified, intrathoracic lymph nodes: Secondary | ICD-10-CM

## 2011-11-03 DIAGNOSIS — R05 Cough: Secondary | ICD-10-CM | POA: Insufficient documentation

## 2011-11-03 DIAGNOSIS — R0609 Other forms of dyspnea: Secondary | ICD-10-CM | POA: Insufficient documentation

## 2011-11-03 DIAGNOSIS — R059 Cough, unspecified: Secondary | ICD-10-CM | POA: Insufficient documentation

## 2011-11-03 DIAGNOSIS — R0989 Other specified symptoms and signs involving the circulatory and respiratory systems: Secondary | ICD-10-CM | POA: Insufficient documentation

## 2011-11-03 DIAGNOSIS — Z01818 Encounter for other preprocedural examination: Secondary | ICD-10-CM | POA: Insufficient documentation

## 2011-11-03 DIAGNOSIS — J988 Other specified respiratory disorders: Secondary | ICD-10-CM | POA: Insufficient documentation

## 2011-11-03 DIAGNOSIS — R062 Wheezing: Secondary | ICD-10-CM | POA: Insufficient documentation

## 2011-11-03 DIAGNOSIS — F17200 Nicotine dependence, unspecified, uncomplicated: Secondary | ICD-10-CM | POA: Insufficient documentation

## 2011-11-03 MED ORDER — ALBUTEROL SULFATE (5 MG/ML) 0.5% IN NEBU
2.5000 mg | INHALATION_SOLUTION | RESPIRATORY_TRACT | Status: DC
Start: 2011-11-03 — End: 2011-11-04
  Administered 2011-11-03: 2.5 mg via RESPIRATORY_TRACT

## 2011-11-07 ENCOUNTER — Ambulatory Visit (HOSPITAL_BASED_OUTPATIENT_CLINIC_OR_DEPARTMENT_OTHER): Payer: Self-pay

## 2011-11-07 ENCOUNTER — Ambulatory Visit: Payer: Self-pay

## 2011-11-07 ENCOUNTER — Ambulatory Visit (HOSPITAL_BASED_OUTPATIENT_CLINIC_OR_DEPARTMENT_OTHER): Payer: Self-pay | Admitting: Hematology & Oncology

## 2011-11-07 ENCOUNTER — Encounter: Payer: Self-pay | Admitting: Hematology & Oncology

## 2011-11-07 VITALS — BP 121/82 | HR 75 | Temp 97.0°F | Ht 71.0 in | Wt 246.0 lb

## 2011-11-07 DIAGNOSIS — Z5111 Encounter for antineoplastic chemotherapy: Secondary | ICD-10-CM

## 2011-11-07 DIAGNOSIS — R1110 Vomiting, unspecified: Secondary | ICD-10-CM

## 2011-11-07 DIAGNOSIS — C8192 Hodgkin lymphoma, unspecified, intrathoracic lymph nodes: Secondary | ICD-10-CM

## 2011-11-07 MED ORDER — DEXAMETHASONE SODIUM PHOSPHATE 4 MG/ML IJ SOLN
20.0000 mg | Freq: Once | INTRAMUSCULAR | Status: AC
  Administered 2011-11-07: 20 mg via INTRAVENOUS

## 2011-11-07 MED ORDER — SODIUM CHLORIDE 0.9 % IV SOLN
Freq: Once | INTRAVENOUS | Status: AC
  Administered 2011-11-07: 10:00:00 via INTRAVENOUS

## 2011-11-07 MED ORDER — SODIUM CHLORIDE 0.9 % IV SOLN
375.0000 mg/m2 | Freq: Once | INTRAVENOUS | Status: AC
  Administered 2011-11-07: 880 mg via INTRAVENOUS
  Filled 2011-11-07: qty 44

## 2011-11-07 MED ORDER — PROMETHAZINE HCL 25 MG PO TABS: 25.0000 mg | ORAL_TABLET | Freq: Four times a day (QID) | ORAL | Status: DC | PRN

## 2011-11-07 MED ORDER — SODIUM CHLORIDE 0.9 % IV SOLN
10.0000 [IU]/m2 | Freq: Once | INTRAVENOUS | Status: AC
  Administered 2011-11-07: 23 [IU] via INTRAVENOUS
  Filled 2011-11-07: qty 7.7

## 2011-11-07 MED ORDER — SODIUM CHLORIDE 0.9 % IJ SOLN
10.0000 mL | INTRAMUSCULAR | Status: DC | PRN
  Administered 2011-11-07: 10 mL
  Filled 2011-11-07: qty 10

## 2011-11-07 MED ORDER — DOXORUBICIN HCL CHEMO IV INJECTION 2 MG/ML
25.0000 mg/m2 | Freq: Once | INTRAVENOUS | Status: AC
  Administered 2011-11-07: 58 mg via INTRAVENOUS
  Filled 2011-11-07: qty 29

## 2011-11-07 MED ORDER — VINBLASTINE SULFATE CHEMO INJECTION 1 MG/ML
6.0000 mg/m2 | Freq: Once | INTRAVENOUS | Status: AC
  Administered 2011-11-07: 14 mg via INTRAVENOUS
  Filled 2011-11-07: qty 14

## 2011-11-07 MED ORDER — HEPARIN SOD (PORK) LOCK FLUSH 100 UNIT/ML IV SOLN
500.0000 [IU] | Freq: Once | INTRAVENOUS | Status: AC | PRN
  Administered 2011-11-07: 500 [IU]
  Filled 2011-11-07: qty 5

## 2011-11-07 MED ORDER — HYDROCODONE-HOMATROPINE 5-1.5 MG/5ML PO SYRP: 5.0000 mL | ORAL_SOLUTION | Freq: Four times a day (QID) | ORAL | Status: AC | PRN

## 2011-11-07 MED ORDER — ONDANSETRON 16 MG/50ML IVPB (CHCC)
16.0000 mg | Freq: Once | INTRAVENOUS | Status: AC
  Administered 2011-11-07: 16 mg via INTRAVENOUS
  Filled 2011-11-07: qty 16

## 2011-11-07 MED ORDER — DEXAMETHASONE 4 MG PO TABS: ORAL_TABLET | ORAL | Status: DC

## 2011-11-07 NOTE — Progress Notes (Signed)
This office note has been dictated.

## 2011-11-07 NOTE — Progress Notes (Signed)
CC:   Kevin Octave, MD  DIAGNOSIS:  Stage IIA nodular sclerosing Hodgkin's disease.  CURRENT THERAPY:  The patient is to start ABVD today.  INTERIM HISTORY:  Kevin Reese comes in for followup.  We initially saw him back in early November.  At that point in time, he needed a complete workup.  We did go ahead and get him set up with all of his scans and biopsies.  He did undergo a PET scan.  This was done on 11/07.  The PET scan basically showed hypermetabolic activity in the mediastinum.  There was some uptake in the bilateral supraclavicular nodes.  The anterior mediastinal mass measured 3.7 x 4.8 cm.  Everything else looked okay on the PET scan.  He was then seen by Dr. Orson Aloe.  Dr. Orson Aloe did a VATS procedure on 09/30/2011.  Biopsy was done of the mediastinal lymph node.  The pathology report ([id number]) showed nodular sclerosing Hodgkin's disease.  He had 5 lymph nodes sampled, and 4 came back positive.  He then underwent a bone marrow biopsy and aspirate.  This was done on 10/12/2011.  The bone marrow report ([id number]) was negative for any evidence of Hodgkin's disease.  As such, he has stage IIA nodular sclerosing Hodgkin's disease.  We then got a pulmonary function test on him.  Pulmonary function tests came back okay with his diffusing capacity being within normal limits.  I am pretty sure he also had a MUGA scan done.  Unfortunately, I do not have these results.  There is no reason for him to have cardiac dysfunction.  He did have a Port-A-Cath placed.  He has chronic back pain issues.  He has had back surgery twice already.  He has had no fever.  He has had no cough or shortness of breath.  He has had no nausea or vomiting.  He has had no rashes.  There has been no pruritus.  PHYSICAL EXAM:  General:  This is a well-developed, well-nourished white gentleman in no obvious distress.  Vital Signs:  Temperature 97, pulse 75, respiratory rate 18, blood pressure  121/82.  Weight is 246. Head/Neck:  Exam shows a normocephalic, atraumatic skull.  There are no ocular or are no ocular or oral lesions.  There are no palpable cervical or supraclavicular lymph nodes.  Lungs:  Clear bilaterally.  Cardiac: Regular rate and rhythm with a normal S1, S2.  There are no murmurs, rubs or bruits.  Abdomen:  Soft with good bowel sounds.  He is somewhat on the obese side.  There is no palpable hepatosplenomegaly.  Axillae: Axillary exam shows no bilateral axillary adenopathy.  Back:  No tenderness over the spine, ribs, or hips.  Extremities:  No clubbing, cyanosis or edema.  Neurologic:  Exam shows no focal neurological deficits.  IMPRESSION:  Kevin Reese is a 31 year old gentleman with stage IIA nodular sclerosing Hodgkin's disease.  I would have to put him in a favorable category.  He really has not had any has symptoms that would suggest unfavorable disease or "B" disease.  I probably will go ahead and treat him with 4 cycles of ABVD followed by involved field radiation.  I really suspect that we should cure him.  I believe cure rates should be over 90%.  He came in with his fiancee and mom.  I explained everything to them. We will move ahead and start his treatment today.  TIME SPENT:  I spent a good 45 minutes with him and his family, explaining my  recommendations.    ______________________________ Josph Macho, M.D. PRE/MEDQ  D:  11/07/2011  T:  11/07/2011  Job:  [id number]

## 2011-11-07 NOTE — Progress Notes (Signed)
Kevin Reese mailed his EPP application to [employer] but did not include income information.  He brought this info to his visit today and I faxed to [facility] to process his application.

## 2011-11-07 NOTE — Progress Notes (Signed)
Addended by: Arlan Organ R on: 11/07/2011 01:55 PM   Modules accepted: Orders

## 2011-11-07 NOTE — Patient Instructions (Addendum)
[facility] Discharge Instructions for Patients Receiving Chemotherapy  Today you received the following chemotherapy agent Adriamycin, Bleomycin, Velban, DTIC  To help prevent nausea and vomiting after your treatment, we encourage you to take your nausea medication Phenergan 1 po q 6 hours prn nausea and Decadron on Days 2, 3, 4 as prescribed.  Patient to call us for uncontrolled nausea and vomiting Begin taking it at and take it as often as prescribed for the next hours.   If you develop nausea and vomiting that is not controlled by your nausea medication, call the clinic. If it is after clinic hours your family physician or the after hours number for the clinic or go to the Emergency Department.   BELOW ARE SYMPTOMS THAT SHOULD BE REPORTED IMMEDIATELY:  *FEVER GREATER THAN 100.5 F  *CHILLS WITH OR WITHOUT FEVER  NAUSEA AND VOMITING THAT IS NOT CONTROLLED WITH YOUR NAUSEA MEDICATION  *UNUSUAL SHORTNESS OF BREATH  *UNUSUAL BRUISING OR BLEEDING  TENDERNESS IN MOUTH AND THROAT WITH OR WITHOUT PRESENCE OF ULCERS  *URINARY PROBLEMS  *BOWEL PROBLEMS  UNUSUAL RASH Items with * indicate a potential emergency and should be followed up as soon as possible.  One of the nurses will contact you 24 hours after your treatment. Please let the nurse know about any problems that you may have experienced. Feel free to call the clinic you have any questions or concerns. The clinic phone number is [phone]   I have been informed and understand all the instructions given to me. I know to contact the clinic, my physician, or go to the Emergency Department if any problems should occur. I do not have any questions at this time, but understand that I may call the clinic during office hours   should I have any questions or need assistance in obtaining follow up care.    __________________________________________  _____________  __________ Signature of Patient or Authorized  Representative            Date                   Time    __________________________________________ Nurse's Signature

## 2011-11-08 ENCOUNTER — Encounter: Payer: Self-pay | Admitting: *Deleted

## 2011-11-08 NOTE — Progress Notes (Signed)
24 Hour Chemotherapy Follow up Call  Kevin Reese called at home following administration of Velban, DTIC, Adriamycin, Bleomycin chemotherapy. Patient reports no complaints  Medications reviewed (antiemetics)2   Following interventions recommended Continue to take antiemetics as scheduled.  Reviewed all post chemotherapy instructions with patient and when should call clinic or MD.  Patient verbalized understanding.

## 2011-11-21 ENCOUNTER — Other Ambulatory Visit: Payer: Self-pay | Admitting: Lab

## 2011-11-21 ENCOUNTER — Ambulatory Visit (HOSPITAL_BASED_OUTPATIENT_CLINIC_OR_DEPARTMENT_OTHER): Payer: Self-pay | Admitting: Hematology & Oncology

## 2011-11-21 ENCOUNTER — Ambulatory Visit (HOSPITAL_BASED_OUTPATIENT_CLINIC_OR_DEPARTMENT_OTHER): Payer: Self-pay

## 2011-11-21 VITALS — BP 98/67 | HR 85 | Temp 97.1°F

## 2011-11-21 DIAGNOSIS — I959 Hypotension, unspecified: Secondary | ICD-10-CM

## 2011-11-21 DIAGNOSIS — C8192 Hodgkin lymphoma, unspecified, intrathoracic lymph nodes: Secondary | ICD-10-CM

## 2011-11-21 DIAGNOSIS — C8190 Hodgkin lymphoma, unspecified, unspecified site: Secondary | ICD-10-CM

## 2011-11-21 DIAGNOSIS — Z5111 Encounter for antineoplastic chemotherapy: Secondary | ICD-10-CM

## 2011-11-21 DIAGNOSIS — E875 Hyperkalemia: Secondary | ICD-10-CM

## 2011-11-21 LAB — CBC WITH DIFFERENTIAL (CANCER CENTER ONLY)
BASO#: 0.1 10*3/uL (ref 0.0–0.2)
Eosinophils Absolute: 0.1 10*3/uL (ref 0.0–0.5)
HCT: 38.6 % — ABNORMAL LOW (ref 38.7–49.9)
HGB: 13 g/dL (ref 13.0–17.1)
LYMPH#: 2.3 10*3/uL (ref 0.9–3.3)
MCH: 29.1 pg (ref 28.0–33.4)
MONO%: 17.2 % — ABNORMAL HIGH (ref 0.0–13.0)
NEUT#: 1.8 10*3/uL (ref 1.5–6.5)
NEUT%: 34.5 % — ABNORMAL LOW (ref 40.0–80.0)
RBC: 4.46 10*6/uL (ref 4.20–5.70)

## 2011-11-21 LAB — COMPREHENSIVE METABOLIC PANEL
Albumin: 4.3 g/dL (ref 3.5–5.2)
BUN: 25 mg/dL — ABNORMAL HIGH (ref 6–23)
Calcium: 10.1 mg/dL (ref 8.4–10.5)
Chloride: 100 mEq/L (ref 96–112)
Creatinine, Ser: 1.3 mg/dL (ref 0.50–1.35)
Glucose, Bld: 151 mg/dL — ABNORMAL HIGH (ref 70–99)
Potassium: 5.5 mEq/L — ABNORMAL HIGH (ref 3.5–5.3)

## 2011-11-21 MED ORDER — DEXAMETHASONE SODIUM PHOSPHATE 4 MG/ML IJ SOLN
20.0000 mg | Freq: Once | INTRAMUSCULAR | Status: AC
  Administered 2011-11-21: 20 mg via INTRAVENOUS

## 2011-11-21 MED ORDER — DACARBAZINE 200 MG IV SOLR
375.0000 mg/m2 | Freq: Once | INTRAVENOUS | Status: AC
  Administered 2011-11-21: 880 mg via INTRAVENOUS
  Filled 2011-11-21: qty 44

## 2011-11-21 MED ORDER — SODIUM CHLORIDE 0.9 % IJ SOLN
10.0000 mL | INTRAMUSCULAR | Status: DC | PRN
  Administered 2011-11-21: 10 mL
  Filled 2011-11-21: qty 10

## 2011-11-21 MED ORDER — HEPARIN SOD (PORK) LOCK FLUSH 100 UNIT/ML IV SOLN
500.0000 [IU] | Freq: Once | INTRAVENOUS | Status: AC | PRN
  Administered 2011-11-21: 500 [IU]
  Filled 2011-11-21: qty 5

## 2011-11-21 MED ORDER — SODIUM CHLORIDE 0.9 % IV SOLN
10.0000 [IU]/m2 | Freq: Once | INTRAVENOUS | Status: AC
  Administered 2011-11-21: 23 [IU] via INTRAVENOUS
  Filled 2011-11-21: qty 7.7

## 2011-11-21 MED ORDER — DOXORUBICIN HCL CHEMO IV INJECTION 2 MG/ML
25.0000 mg/m2 | Freq: Once | INTRAVENOUS | Status: AC
  Administered 2011-11-21: 58 mg via INTRAVENOUS
  Filled 2011-11-21: qty 29

## 2011-11-21 MED ORDER — SODIUM CHLORIDE 0.9 % IV SOLN
Freq: Once | INTRAVENOUS | Status: AC
  Administered 2011-11-21: 12:00:00 via INTRAVENOUS

## 2011-11-21 MED ORDER — VINBLASTINE SULFATE CHEMO INJECTION 1 MG/ML
6.0000 mg/m2 | Freq: Once | INTRAVENOUS | Status: AC
  Administered 2011-11-21: 14 mg via INTRAVENOUS
  Filled 2011-11-21: qty 14

## 2011-11-21 MED ORDER — ONDANSETRON 16 MG/50ML IVPB (CHCC)
16.0000 mg | Freq: Once | INTRAVENOUS | Status: AC
  Administered 2011-11-21: 16 mg via INTRAVENOUS
  Filled 2011-11-21: qty 16

## 2011-11-21 NOTE — Progress Notes (Signed)
This office note has been dictated.
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
## 2011-11-23 DIAGNOSIS — Z0271 Encounter for disability determination: Secondary | ICD-10-CM

## 2011-11-28 ENCOUNTER — Ambulatory Visit (HOSPITAL_BASED_OUTPATIENT_CLINIC_OR_DEPARTMENT_OTHER)
Admission: RE | Admit: 2011-11-28 | Discharge: 2011-11-28 | Disposition: A | Discharge status: Home / Self Care | Payer: Self-pay | Source: Ambulatory Visit | Attending: Hematology & Oncology | Admitting: Hematology & Oncology

## 2011-11-28 ENCOUNTER — Other Ambulatory Visit: Payer: Self-pay | Admitting: *Deleted

## 2011-11-28 ENCOUNTER — Ambulatory Visit (HOSPITAL_BASED_OUTPATIENT_CLINIC_OR_DEPARTMENT_OTHER): Payer: Self-pay | Admitting: Hematology & Oncology

## 2011-11-28 DIAGNOSIS — C8119 Nodular sclerosis classical Hodgkin lymphoma, extranodal and solid organ sites: Secondary | ICD-10-CM

## 2011-11-28 DIAGNOSIS — R05 Cough: Secondary | ICD-10-CM

## 2011-11-28 DIAGNOSIS — R0602 Shortness of breath: Secondary | ICD-10-CM

## 2011-11-28 DIAGNOSIS — F17200 Nicotine dependence, unspecified, uncomplicated: Secondary | ICD-10-CM

## 2011-11-28 MED ORDER — FLUNISOLIDE 250 MCG/ACT IN AERS: 2.0000 | INHALATION_SPRAY | Freq: Two times a day (BID) | RESPIRATORY_TRACT | Status: DC

## 2011-11-28 MED ORDER — OSELTAMIVIR PHOSPHATE 75 MG PO CAPS: 75.0000 mg | ORAL_CAPSULE | Freq: Two times a day (BID) | ORAL | Status: AC

## 2011-11-28 MED ORDER — IPRATROPIUM-ALBUTEROL 0.5-2.5 (3) MG/3ML IN SOLN
3.0000 mL | RESPIRATORY_TRACT | Status: DC
  Administered 2011-11-28: 3 mL via RESPIRATORY_TRACT

## 2011-11-28 MED ORDER — IPRATROPIUM-ALBUTEROL 0.5-2.5 (3) MG/3ML IN SOLN
3.0000 mL | Freq: Once | RESPIRATORY_TRACT | Status: DC
Start: 2011-11-28 — End: 2011-11-28

## 2011-11-28 MED ORDER — AZITHROMYCIN 250 MG PO TABS: ORAL_TABLET | ORAL | Status: AC

## 2011-11-28 NOTE — Progress Notes (Signed)
This office note has been dictated.

## 2011-11-29 NOTE — Progress Notes (Signed)
DIAGNOSIS:  Stage IIa nodular sclerosing Hodgkin disease.  CURRENT THERAPY:  The patient is status post 1 cycle of ABVD.  Kevin Reese comes in for an unscheduled visit.  His fiancee had called [place] saying he was having some shortness of breath and cough.  He does not cough up any mucus.  He was coughing up some clear sputum.  There is no hemoptysis.  His fiancee said this started a day or so ago.  He was just treated last week with his day 15 of cycle 1.  He is still smoking.  He has had a little nausea but no vomiting.  There has been no diarrhea.  Again, he has not had any fever.  He has had some sweats.  PHYSICAL EXAMINATION:  This is a well-developed, well-nourished gentleman in no obvious distress.  Vital signs:  Temperature 97.1, pulse 85, respiratory rate 18, blood pressure 98/67.  Head and neck:  Shows no ocular or oral lesions.  There are no palpable cervical or cervical lymph nodes.  Lungs:  Clear bilaterally.  I do not hear any rales, wheezing or rhonchi.  Cardiac:  Regular rate and rhythm with a normal S1, S2.  There are no murmurs, rubs or bruits.  Abdomen:  Soft, mildly obese.  He has good bowel sounds.  There is no fluid wave.  No palpable hepatosplenomegaly.  Extremities:  Show no clubbing, cyanosis, or edema. Neurologic:  Shows no focal neurological deficits.  We did do a chest x-ray on him.  The report did come back as a left upper lobe infiltrate.  IMPRESSION:  Kevin Reese is a 31 year old gentleman with nodular sclerosing Hodgkin disease.  He has favorable disease.  This was found asymptomatically.  We will go ahead and put him on some antibiotics.  I gave him a Z-Pak. We did give him a nebulizer treatment in the office.  This did help him a little bit.  I do not think that we have to go with IV antibiotics right now.  I did prescribe an inhaler for him that he can take at home.  We will put him on some Flonase.  I told Kevin Reese that if he has any further  problems that he may have to go to the emergency room and actually be admitted for IV antibiotics.    ______________________________ Josph Macho, M.D. PRE/MEDQ  D:  11/29/2011  T:  11/29/2011  Job:  [id number]

## 2011-11-29 NOTE — Progress Notes (Signed)
DIAGNOSIS:  Stage IIA nodular sclerosing Hodgkin disease.  CURRENT THERAPY:  The patient is status post 1 cycle of ABVD.  INTERIM HISTORY:  Kevin Reese comes in for followup.  He is having a lot of cough.  He feels maybe a little short of breath.  He has had no fever. The cough is productive of some clear sputum.  He says he has a sore throat. He has lot of arthralgias and myalgias.  He has had a little nausea.  He has not had any diarrhea.  He has had a little odynophagia.  He has had occasional headaches.  He has not noticed any leg swelling.  We wanted him to come in today so we could take a look at him.  He got his last cycle of chemotherapy on 01/21.  PHYSICAL EXAM:  General: This is a mildly obese white gentleman in no obvious distress.  Vital signs: 97.1, pulse 85, respiratory rate 16, blood pressure 198/67, and saturation on room air is 98%.  He is on home O2. His head and neck exam shows some pharyngeal erythema.  There is no mucositis.  No adenopathy noted on his neck.  His lungs are clear bilaterally.  There may be some slight expiratory wheezes at the bases. Cardiac examination:  Regular rate and rhythm with a normal S1 and S2. There are no murmurs, rubs, or bruits.  Abdominal exam: Soft, mildly obese.  He has good bowel sounds.  There is no fluid wave.  There is no palpable hepatosplenomegaly.  Extremities: Shows no clubbing, cyanosis, or edema.  No palpable venous cords noted in his legs. He has a negative Homans sign bilaterally.  Good pulses in his distal extremities.  Skin exam: No rash, ecchymosis, or petechia.  IMPRESSION:  Mr. Casimir is a 31 year old gentleman with stage IIA nodular sclerosing Hodgkin disease.  He now comes in with maybe a possible viral- type syndrome.  He is still smoking.  I suppose the symptoms might also be somewhat chemo-related.  I think I will get a chest x-ray on him.  I want to make sure that we are not looking any kind of pneumonitis  from his bleomycin.  We will get this set up today.  I am going to give him a nebulizer today.  I also want to get him on some Tamiflu and also, I think, a Z-PAK may not be a bad idea, just in case we are looking at some kind of atypical pulmonary infection.  Hopefully, Mr. Guderian will improve over the next couple of days.  If Mr. Smeltz worsens, I told his fiancee that he is going to have to go to the emergency room and possibly be admitted for a better evaluation.    ______________________________ Josph Macho, M.D. PRE/MEDQ  D:  11/28/2011  T:  11/29/2011  Job:  [id number]

## 2011-12-05 ENCOUNTER — Other Ambulatory Visit (HOSPITAL_BASED_OUTPATIENT_CLINIC_OR_DEPARTMENT_OTHER): Payer: Self-pay | Admitting: Lab

## 2011-12-05 ENCOUNTER — Ambulatory Visit (HOSPITAL_BASED_OUTPATIENT_CLINIC_OR_DEPARTMENT_OTHER): Payer: Self-pay

## 2011-12-05 ENCOUNTER — Ambulatory Visit (HOSPITAL_BASED_OUTPATIENT_CLINIC_OR_DEPARTMENT_OTHER): Payer: Self-pay | Admitting: Hematology & Oncology

## 2011-12-05 VITALS — BP 138/78 | HR 83 | Temp 97.0°F

## 2011-12-05 VITALS — BP 135/78 | HR 85 | Temp 97.0°F | Ht 71.0 in | Wt 242.0 lb

## 2011-12-05 DIAGNOSIS — C8190 Hodgkin lymphoma, unspecified, unspecified site: Secondary | ICD-10-CM

## 2011-12-05 DIAGNOSIS — C8119 Nodular sclerosis classical Hodgkin lymphoma, extranodal and solid organ sites: Secondary | ICD-10-CM

## 2011-12-05 DIAGNOSIS — C8192 Hodgkin lymphoma, unspecified, intrathoracic lymph nodes: Secondary | ICD-10-CM

## 2011-12-05 DIAGNOSIS — Z5111 Encounter for antineoplastic chemotherapy: Secondary | ICD-10-CM

## 2011-12-05 LAB — CBC WITH DIFFERENTIAL (CANCER CENTER ONLY)
BASO#: 0.1 10*3/uL (ref 0.0–0.2)
EOS%: 0.9 % (ref 0.0–7.0)
Eosinophils Absolute: 0.1 10*3/uL (ref 0.0–0.5)
HCT: 34.8 % — ABNORMAL LOW (ref 38.7–49.9)
HGB: 11.7 g/dL — ABNORMAL LOW (ref 13.0–17.1)
MCH: 28.4 pg (ref 28.0–33.4)
MCHC: 33.6 g/dL (ref 32.0–35.9)
MCV: 85 fL (ref 82–98)
MONO%: 10.3 % (ref 0.0–13.0)
NEUT%: 61.4 % (ref 40.0–80.0)
RBC: 4.12 10*6/uL — ABNORMAL LOW (ref 4.20–5.70)

## 2011-12-05 LAB — COMPREHENSIVE METABOLIC PANEL
AST: 27 U/L (ref 0–37)
Albumin: 4.3 g/dL (ref 3.5–5.2)
Alkaline Phosphatase: 40 U/L (ref 39–117)
BUN: 11 mg/dL (ref 6–23)
Calcium: 9.1 mg/dL (ref 8.4–10.5)
Creatinine, Ser: 1.16 mg/dL (ref 0.50–1.35)
Glucose, Bld: 139 mg/dL — ABNORMAL HIGH (ref 70–99)

## 2011-12-05 MED ORDER — SODIUM CHLORIDE 0.9 % IV SOLN
375.0000 mg/m2 | Freq: Once | INTRAVENOUS | Status: AC
  Administered 2011-12-05: 880 mg via INTRAVENOUS
  Filled 2011-12-05: qty 44

## 2011-12-05 MED ORDER — DEXAMETHASONE SODIUM PHOSPHATE 4 MG/ML IJ SOLN
20.0000 mg | Freq: Once | INTRAMUSCULAR | Status: AC
  Administered 2011-12-05: 20 mg via INTRAVENOUS

## 2011-12-05 MED ORDER — ONDANSETRON 16 MG/50ML IVPB (CHCC)
16.0000 mg | Freq: Once | INTRAVENOUS | Status: AC
  Administered 2011-12-05: 16 mg via INTRAVENOUS
  Filled 2011-12-05: qty 16

## 2011-12-05 MED ORDER — SODIUM CHLORIDE 0.9 % IV SOLN
10.0000 [IU]/m2 | Freq: Once | INTRAVENOUS | Status: AC
  Administered 2011-12-05: 23 [IU] via INTRAVENOUS
  Filled 2011-12-05: qty 7.7

## 2011-12-05 MED ORDER — HEPARIN SOD (PORK) LOCK FLUSH 100 UNIT/ML IV SOLN
500.0000 [IU] | Freq: Once | INTRAVENOUS | Status: AC | PRN
  Administered 2011-12-05: 500 [IU]
  Filled 2011-12-05: qty 5

## 2011-12-05 MED ORDER — VINBLASTINE SULFATE CHEMO INJECTION 1 MG/ML
6.0000 mg/m2 | Freq: Once | INTRAVENOUS | Status: AC
  Administered 2011-12-05: 14 mg via INTRAVENOUS
  Filled 2011-12-05: qty 14

## 2011-12-05 MED ORDER — DOXORUBICIN HCL CHEMO IV INJECTION 2 MG/ML
25.0000 mg/m2 | Freq: Once | INTRAVENOUS | Status: AC
  Administered 2011-12-05: 58 mg via INTRAVENOUS
  Filled 2011-12-05: qty 29

## 2011-12-05 MED ORDER — SODIUM CHLORIDE 0.9 % IV SOLN
Freq: Once | INTRAVENOUS | Status: AC
  Administered 2011-12-05: 12:00:00 via INTRAVENOUS

## 2011-12-05 MED ORDER — SODIUM CHLORIDE 0.9 % IJ SOLN
10.0000 mL | INTRAMUSCULAR | Status: DC | PRN
  Administered 2011-12-05: 10 mL
  Filled 2011-12-05: qty 10

## 2011-12-05 NOTE — Progress Notes (Signed)
This office note has been dictated.

## 2011-12-06 NOTE — Progress Notes (Signed)
DIAGNOSIS:  Stage IIA nodular sclerosing Hodgkin disease.  CURRENT THERAPY:  Patient is status post 1 cycle of ABVD.  INTERVAL HISTORY:  [name] comes in for followup.  We actually saw him back on January 28th.  He came in with cough.  He had some chest discomfort.  We did do an x-ray on him.  Surprisingly enough, the x-ray did show some pneumonia, I think it was in the left lung.  We did put him on some antibiotics.  He feels a whole lot better. He is not coughing.  He has had a good appetite.  There has been some slight gingival tenderness.  He has had no gingival bleeding.  There has been no diarrhea.  He has had no rashes.  He has had no headache.  PHYSICAL EXAMINATION:  This is a well-developed, well-nourished white gentleman in no obvious distress.  Vital signs 97, pulse 85, respiratory rate 18, blood pressure 135/78.  Weight is 242.  Head and neck: Normocephalic, atraumatic skull.  There are no ocular or oral lesions. There are no palpable cervical or supraclavicular lymph nodes.  Lungs: Clear bilaterally.  No rales or rhonchi.  Cardiac:  Regular rate and rhythm with a normal S1, S2.  There are no murmurs or rubs, or bruits. Abdomen:  Soft with good bowel sounds.  There is no palpable abdominal mass.  There is no fluid wave.  No palpable hepatosplenomegaly.  Back: No tenderness over the spine, ribs, or hips.  Extremities:  No clubbing, cyanosis or edema.  Skin:  No rashes, ecchymosis or petechia.  LABORATORY STUDIES:  White count is 10.7, hemoglobin 11.7, hematocrit 34.8, platelet count is 347.  IMPRESSION:  [name] is a 31 year old gentleman who was found incidentally to have stage IIA nodular sclerosing Hodgkin disease.  He has done well with treatment.  Again, this pneumonia was community- acquired.  Hopefully, this will not be a problem with his next cycle.  Will go ahead and start his 2nd cycle today. We will get him back in 2 weeks. We will then plan for a followup  PET scan after the 2nd cycle.    ______________________________ Josph Macho, M.D. PRE/MEDQ  D:  12/05/2011  T:  12/06/2011  Job:  [id number]

## 2011-12-14 ENCOUNTER — Telehealth: Payer: Self-pay | Admitting: Hematology & Oncology

## 2011-12-14 NOTE — Telephone Encounter (Signed)
Faxed records to [employer] in response to request. °

## 2011-12-19 ENCOUNTER — Ambulatory Visit: Payer: Self-pay | Admitting: Hematology & Oncology

## 2011-12-19 ENCOUNTER — Other Ambulatory Visit (HOSPITAL_BASED_OUTPATIENT_CLINIC_OR_DEPARTMENT_OTHER): Payer: Self-pay | Admitting: Lab

## 2011-12-19 ENCOUNTER — Ambulatory Visit: Payer: Self-pay

## 2011-12-19 ENCOUNTER — Other Ambulatory Visit: Payer: Self-pay | Admitting: Lab

## 2011-12-19 ENCOUNTER — Ambulatory Visit (HOSPITAL_BASED_OUTPATIENT_CLINIC_OR_DEPARTMENT_OTHER): Payer: Self-pay

## 2011-12-19 ENCOUNTER — Ambulatory Visit (HOSPITAL_BASED_OUTPATIENT_CLINIC_OR_DEPARTMENT_OTHER): Payer: Self-pay | Admitting: Hematology & Oncology

## 2011-12-19 VITALS — BP 149/90 | HR 90 | Temp 98.7°F | Ht 70.0 in | Wt 237.0 lb

## 2011-12-19 DIAGNOSIS — Z5111 Encounter for antineoplastic chemotherapy: Secondary | ICD-10-CM

## 2011-12-19 DIAGNOSIS — C8119 Nodular sclerosis classical Hodgkin lymphoma, extranodal and solid organ sites: Secondary | ICD-10-CM

## 2011-12-19 DIAGNOSIS — C8192 Hodgkin lymphoma, unspecified, intrathoracic lymph nodes: Secondary | ICD-10-CM

## 2011-12-19 LAB — CBC WITH DIFFERENTIAL (CANCER CENTER ONLY)
BASO#: 0.1 10*3/uL (ref 0.0–0.2)
Eosinophils Absolute: 0.4 10*3/uL (ref 0.0–0.5)
HCT: 35.5 % — ABNORMAL LOW (ref 38.7–49.9)
LYMPH%: 36 % (ref 14.0–48.0)
MCH: 28.5 pg (ref 28.0–33.4)
MCV: 84 fL (ref 82–98)
MONO#: 1.4 10*3/uL — ABNORMAL HIGH (ref 0.1–0.9)
NEUT%: 27.7 % — ABNORMAL LOW (ref 40.0–80.0)
RBC: 4.25 10*6/uL (ref 4.20–5.70)
WBC: 5.4 10*3/uL (ref 4.0–10.0)

## 2011-12-19 MED ORDER — SODIUM CHLORIDE 0.9 % IV SOLN
Freq: Once | INTRAVENOUS | Status: AC
  Administered 2011-12-19: 10:00:00 via INTRAVENOUS

## 2011-12-19 MED ORDER — SODIUM CHLORIDE 0.9 % IV SOLN
375.0000 mg/m2 | Freq: Once | INTRAVENOUS | Status: AC
  Administered 2011-12-19: 880 mg via INTRAVENOUS
  Filled 2011-12-19: qty 44

## 2011-12-19 MED ORDER — DOXORUBICIN HCL CHEMO IV INJECTION 2 MG/ML
25.0000 mg/m2 | Freq: Once | INTRAVENOUS | Status: AC
  Administered 2011-12-19: 58 mg via INTRAVENOUS
  Filled 2011-12-19: qty 29

## 2011-12-19 MED ORDER — ONDANSETRON 16 MG/50ML IVPB (CHCC)
16.0000 mg | Freq: Once | INTRAVENOUS | Status: AC
  Administered 2011-12-19: 16 mg via INTRAVENOUS
  Filled 2011-12-19: qty 16

## 2011-12-19 MED ORDER — SODIUM CHLORIDE 0.9 % IV SOLN
10.0000 [IU]/m2 | Freq: Once | INTRAVENOUS | Status: AC
  Administered 2011-12-19: 23 [IU] via INTRAVENOUS
  Filled 2011-12-19: qty 7.7

## 2011-12-19 MED ORDER — VINBLASTINE SULFATE CHEMO INJECTION 1 MG/ML
6.0000 mg/m2 | Freq: Once | INTRAVENOUS | Status: AC
  Administered 2011-12-19: 14 mg via INTRAVENOUS
  Filled 2011-12-19: qty 14

## 2011-12-19 MED ORDER — DEXAMETHASONE SODIUM PHOSPHATE 4 MG/ML IJ SOLN
20.0000 mg | Freq: Once | INTRAMUSCULAR | Status: AC
  Administered 2011-12-19: 20 mg via INTRAVENOUS

## 2011-12-19 NOTE — Progress Notes (Signed)
This office note has been dictated.

## 2011-12-20 NOTE — Progress Notes (Signed)
DIAGNOSIS:  Stage IIA nodular sclerosing Hodgkin disease.  CURRENT THERAPY:  The patient on day 15 of cycle number 2 of ABVD.  INTERVAL HISTORY:  Mr. Wegner comes in for followup.  This is the best I have seen him look since we started treatment on him.  He looks really good.  He has tolerated his last cycle of chemo without problems.  He does not have much of a cough.  He has had no fever.  There have been no mouth sores.  He has had no nausea or  vomiting.  There has been no change in bowel or bladder habits.  He has not noted any rashes.  PHYSICAL EXAMINATION:  This is a well-developed, well-nourished white gentleman in no obvious distress.  Vital signs:  98.7, pulse 90, respiratory rate 16, blood pressure 149/90.  Weight is 237.  Head and neck:  Normocephalic, atraumatic skull.  There are no ocular or oral lesions.  There are no palpable cervical or supraclavicular lymph nodes. Lungs:  Clear bilaterally.  Cardiac: Regular rate and rhythm with a normal S1, S2.  There are no murmurs or rubs, or bruits.  Abdomen:  Soft with good bowel sounds.  There is no fluid wave.  There is no palpable abdominal mass.  There is no palpable hepatosplenomegaly.  Back:  No tenderness over the spine, ribs, or hips.  Extremities:  No clubbing, cyanosis or edema.  He has good range motion of his joints.  Skin: No rashes, ecchymosis or petechia.  LABORATORY DATA:  White cell count is 5.4, hemoglobin 12.1, hematocrit 35.5, platelet count is 422.  IMPRESSION:  Mr. Viney is a real nice, 31 year old gentleman with stage IIA nodular sclerosing Hodgkin disease.  This will be day 15 of cycle 2 of treatment.  Will go ahead and plan on followup PET scan after this cycle.  Hopefully we will find that he is in remission.  I suspect that we probably could just go with 4 cycles of chemotherapy and then finish up with him after that.  I do not think that we are going to need any radiation therapy.  Again, the PET  scan will help dictate this.  I will get him back in 2 weeks.    ______________________________ Josph Macho, M.D. PRE/MEDQ  D:  12/19/2011  T:  12/20/2011  Job:  [id number]

## 2011-12-28 ENCOUNTER — Ambulatory Visit (INDEPENDENT_AMBULATORY_CARE_PROVIDER_SITE_OTHER)
Admission: RE | Admit: 2011-12-28 | Discharge: 2011-12-28 | Disposition: A | Discharge status: Home / Self Care | Payer: Self-pay | Source: Ambulatory Visit | Attending: Hematology & Oncology | Admitting: Hematology & Oncology

## 2011-12-28 ENCOUNTER — Ambulatory Visit (HOSPITAL_BASED_OUTPATIENT_CLINIC_OR_DEPARTMENT_OTHER)
Admission: RE | Admit: 2011-12-28 | Discharge: 2011-12-28 | Disposition: A | Discharge status: Home / Self Care | Payer: Self-pay | Source: Ambulatory Visit | Attending: Hematology & Oncology | Admitting: Hematology & Oncology

## 2011-12-28 ENCOUNTER — Other Ambulatory Visit: Payer: Self-pay | Admitting: *Deleted

## 2011-12-28 DIAGNOSIS — R918 Other nonspecific abnormal finding of lung field: Secondary | ICD-10-CM

## 2011-12-28 DIAGNOSIS — J40 Bronchitis, not specified as acute or chronic: Secondary | ICD-10-CM

## 2011-12-28 DIAGNOSIS — C8190 Hodgkin lymphoma, unspecified, unspecified site: Secondary | ICD-10-CM | POA: Insufficient documentation

## 2011-12-28 DIAGNOSIS — R05 Cough: Secondary | ICD-10-CM

## 2011-12-28 DIAGNOSIS — C8192 Hodgkin lymphoma, unspecified, intrathoracic lymph nodes: Secondary | ICD-10-CM

## 2011-12-28 MED ORDER — FLUDEOXYGLUCOSE F - 18 (FDG) INJECTION
13.1300 | Freq: Once | INTRAVENOUS | Status: AC | PRN
  Administered 2011-12-28: 13.13 via INTRAVENOUS

## 2011-12-30 ENCOUNTER — Emergency Department (INDEPENDENT_AMBULATORY_CARE_PROVIDER_SITE_OTHER): Payer: Self-pay

## 2011-12-30 ENCOUNTER — Encounter (HOSPITAL_BASED_OUTPATIENT_CLINIC_OR_DEPARTMENT_OTHER): Payer: Self-pay | Admitting: *Deleted

## 2011-12-30 ENCOUNTER — Emergency Department (HOSPITAL_BASED_OUTPATIENT_CLINIC_OR_DEPARTMENT_OTHER)
Admission: EM | Admit: 2011-12-30 | Discharge: 2011-12-30 | Disposition: A | Discharge status: Home / Self Care | Payer: Self-pay | Attending: Emergency Medicine | Admitting: Emergency Medicine

## 2011-12-30 DIAGNOSIS — R079 Chest pain, unspecified: Secondary | ICD-10-CM

## 2011-12-30 DIAGNOSIS — R05 Cough: Secondary | ICD-10-CM | POA: Insufficient documentation

## 2011-12-30 DIAGNOSIS — C8190 Hodgkin lymphoma, unspecified, unspecified site: Secondary | ICD-10-CM

## 2011-12-30 DIAGNOSIS — R059 Cough, unspecified: Secondary | ICD-10-CM | POA: Insufficient documentation

## 2011-12-30 DIAGNOSIS — R5381 Other malaise: Secondary | ICD-10-CM | POA: Insufficient documentation

## 2011-12-30 DIAGNOSIS — R51 Headache: Secondary | ICD-10-CM | POA: Insufficient documentation

## 2011-12-30 DIAGNOSIS — Z87898 Personal history of other specified conditions: Secondary | ICD-10-CM | POA: Insufficient documentation

## 2011-12-30 DIAGNOSIS — Z859 Personal history of malignant neoplasm, unspecified: Secondary | ICD-10-CM | POA: Insufficient documentation

## 2011-12-30 DIAGNOSIS — J45901 Unspecified asthma with (acute) exacerbation: Secondary | ICD-10-CM | POA: Insufficient documentation

## 2011-12-30 DIAGNOSIS — I10 Essential (primary) hypertension: Secondary | ICD-10-CM | POA: Insufficient documentation

## 2011-12-30 DIAGNOSIS — E119 Type 2 diabetes mellitus without complications: Secondary | ICD-10-CM | POA: Insufficient documentation

## 2011-12-30 DIAGNOSIS — R110 Nausea: Secondary | ICD-10-CM | POA: Insufficient documentation

## 2011-12-30 DIAGNOSIS — F17200 Nicotine dependence, unspecified, uncomplicated: Secondary | ICD-10-CM | POA: Insufficient documentation

## 2011-12-30 DIAGNOSIS — R0602 Shortness of breath: Secondary | ICD-10-CM | POA: Insufficient documentation

## 2011-12-30 DIAGNOSIS — F411 Generalized anxiety disorder: Secondary | ICD-10-CM | POA: Insufficient documentation

## 2011-12-30 HISTORY — DX: Malignant (primary) neoplasm, unspecified: C80.1

## 2011-12-30 HISTORY — DX: Pneumonia, unspecified organism: J18.9

## 2011-12-30 LAB — DIFFERENTIAL
Basophils Absolute: 0 10*3/uL (ref 0.0–0.1)
Eosinophils Absolute: 0.1 10*3/uL (ref 0.0–0.7)
Lymphocytes Relative: 35 % (ref 12–46)
Lymphs Abs: 2.5 10*3/uL (ref 0.7–4.0)
Neutro Abs: 3.1 10*3/uL (ref 1.7–7.7)
Neutrophils Relative %: 34 % — ABNORMAL LOW (ref 43–77)

## 2011-12-30 LAB — COMPREHENSIVE METABOLIC PANEL WITH GFR
ALT: 8 U/L (ref 0–53)
AST: 18 U/L (ref 0–37)
Albumin: 3.9 g/dL (ref 3.5–5.2)
Alkaline Phosphatase: 43 U/L (ref 39–117)
BUN: 8 mg/dL (ref 6–23)
CO2: 24 meq/L (ref 19–32)
Calcium: 9.5 mg/dL (ref 8.4–10.5)
Chloride: 95 meq/L — ABNORMAL LOW (ref 96–112)
Creatinine, Ser: 1.2 mg/dL (ref 0.50–1.35)
GFR calc Af Amer: >90 mL/min
GFR calc non Af Amer: 79 mL/min — ABNORMAL LOW
Glucose, Bld: 148 mg/dL — ABNORMAL HIGH (ref 70–99)
Potassium: 3.6 meq/L (ref 3.5–5.1)
Sodium: 134 meq/L — ABNORMAL LOW (ref 135–145)
Total Bilirubin: 0.3 mg/dL (ref 0.3–1.2)
Total Protein: 7.7 g/dL (ref 6.0–8.3)

## 2011-12-30 LAB — URINALYSIS, ROUTINE W REFLEX MICROSCOPIC
Bilirubin Urine: NEGATIVE
Glucose, UA: NEGATIVE mg/dL
Hgb urine dipstick: NEGATIVE
Ketones, ur: NEGATIVE mg/dL
Leukocytes, UA: NEGATIVE
Nitrite: NEGATIVE
Protein, ur: 30 mg/dL — AB
Specific Gravity, Urine: 1.026 (ref 1.005–1.030)
Urobilinogen, UA: 1 mg/dL (ref 0.0–1.0)
pH: 5.5 (ref 5.0–8.0)

## 2011-12-30 LAB — URINE MICROSCOPIC-ADD ON: RBC / HPF: NONE SEEN RBC/hpf (ref <3)

## 2011-12-30 LAB — CBC
Hemoglobin: 11.6 g/dL — ABNORMAL LOW (ref 13.0–17.0)
MCH: 27.9 pg (ref 26.0–34.0)
MCHC: 34.5 g/dL (ref 30.0–36.0)
Platelets: 526 10*3/uL — ABNORMAL HIGH (ref 150–400)
RBC: 4.16 MIL/uL — ABNORMAL LOW (ref 4.22–5.81)

## 2011-12-30 MED ORDER — PREDNISONE 50 MG PO TABS: 60.0000 mg | ORAL_TABLET | Freq: Every day | ORAL | Status: DC

## 2011-12-30 MED ORDER — PREDNISONE 10 MG PO TABS
ORAL_TABLET | ORAL | Status: AC
  Administered 2011-12-30: 60 mg
  Filled 2011-12-30: qty 1

## 2011-12-30 MED ORDER — PREDNISONE 50 MG PO TABS
ORAL_TABLET | ORAL | Status: AC
  Filled 2011-12-30: qty 1

## 2011-12-30 MED ORDER — IPRATROPIUM BROMIDE 0.02 % IN SOLN
0.5000 mg | Freq: Once | RESPIRATORY_TRACT | Status: AC
  Administered 2011-12-30: 0.5 mg via RESPIRATORY_TRACT
  Filled 2011-12-30: qty 2.5

## 2011-12-30 MED ORDER — HYDROCOD POLST-CHLORPHEN POLST 10-8 MG/5ML PO LQCR: 5.0000 mL | Freq: Two times a day (BID) | ORAL | Status: DC

## 2011-12-30 MED ORDER — ALBUTEROL SULFATE (5 MG/ML) 0.5% IN NEBU
5.0000 mg | INHALATION_SOLUTION | Freq: Once | RESPIRATORY_TRACT | Status: AC
  Administered 2011-12-30: 5 mg via RESPIRATORY_TRACT
  Filled 2011-12-30: qty 1

## 2011-12-30 MED ORDER — SODIUM CHLORIDE 0.9 % IV SOLN
Freq: Once | INTRAVENOUS | Status: AC
  Administered 2011-12-30: 16:00:00 via INTRAVENOUS

## 2011-12-30 MED ORDER — ALBUTEROL SULFATE (5 MG/ML) 0.5% IN NEBU
INHALATION_SOLUTION | RESPIRATORY_TRACT | Status: AC
  Filled 2011-12-30: qty 1

## 2011-12-30 MED ORDER — HYDROMORPHONE HCL PF 1 MG/ML IJ SOLN
1.0000 mg | Freq: Once | INTRAMUSCULAR | Status: AC
  Administered 2011-12-30: 1 mg via INTRAVENOUS
  Filled 2011-12-30: qty 1

## 2011-12-30 MED ORDER — ONDANSETRON HCL 4 MG/2ML IJ SOLN
4.0000 mg | Freq: Once | INTRAMUSCULAR | Status: AC
  Administered 2011-12-30: 4 mg via INTRAVENOUS
  Filled 2011-12-30: qty 2

## 2011-12-30 MED ORDER — ALBUTEROL SULFATE (5 MG/ML) 0.5% IN NEBU
5.0000 mg | INHALATION_SOLUTION | Freq: Once | RESPIRATORY_TRACT | Status: AC
  Administered 2011-12-30: 5 mg via RESPIRATORY_TRACT

## 2011-12-30 MED ORDER — PREDNISONE 10 MG PO TABS: ORAL_TABLET | ORAL | Status: DC

## 2011-12-30 NOTE — ED Notes (Signed)
Pt c/o cough, congestion and shortness of breath. Pt sts he had pneumonia 3 wks ago and that resolved.

## 2011-12-30 NOTE — ED Provider Notes (Signed)
History     CSN: [id number]  Arrival date & time 12/30/11  1500   First MD Initiated Contact with Patient 12/30/11 1527      Chief Complaint  Patient presents with  . Shortness of Breath  . Cough    (Consider location/radiation/quality/duration/timing/severity/associated sxs/prior treatment) Patient is a 31 y.o. male presenting with weakness. The history is provided by the patient. No language interpreter was used.  Weakness The primary symptoms include headaches and nausea. The symptoms began 3 to 5 days ago. The episode lasted 5 days. The symptoms are worsening. The neurological symptoms are diffuse. Context: Hodkins lymphoma.  The headache is associated with weakness.  Additional symptoms include weakness. Medical issues do not include hypertension.    Past Medical History  Diagnosis Date  . Mediastinal mass   . Hypertension     was on Azor and Amlodipine/Benazepril;was started on these in 2009;came off of these meds a yr ago bc can't afford them  . Heart murmur     diagnosed in high school  . Chronic back pain     hx buldging disc  . Diabetes mellitus     "borderline" diabetic  . Anxiety     takes Klonopin daily  . 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 Cancer   . Pneumonia     Past Surgical History  Procedure Date  . Back surgery 2009/2011  . Mediastinal mass excision 10/2011    Family History  Problem Relation Age of Onset  . Anesthesia problems Neg Hx   . Hypotension Neg Hx   . Malignant hyperthermia Neg Hx   . Pseudochol deficiency Neg Hx     History  Substance Use Topics  . Smoking status: Current Everyday Smoker -- 0.5 packs/day for 12 years    Types: Cigarettes  . Smokeless tobacco: Never Used  . Alcohol Use: 3.6 oz/week    6 Cans of beer per week      Review of Systems  Gastrointestinal: Positive for nausea.  Neurological: Positive for weakness and headaches.  All other systems reviewed and are  negative.    Allergies  Penicillins  Home Medications   Current Outpatient Rx  Name Route Sig Dispense Refill  . CLONAZEPAM 2 MG PO TABS Oral Take 2 mg by mouth 4 (four) times daily as needed. For anxiety    . FLUNISOLIDE 250 MCG/ACT IN AERS Inhalation Inhale 2 puffs into the lungs 2 (two) times daily. 1 Inhaler 0  . GUAIFENESIN ER 600 MG PO TB12 Oral Take 1 tablet (600 mg total) by mouth 2 (two) times daily. For cough.    . METOPROLOL TARTRATE 25 MG PO TABS Oral Take 25 mg by mouth 2 (two) times daily.      Marland Kitchen PHENERGAN PO Oral Take by mouth.    . NYQUIL PO Oral Take 5 mLs by mouth daily as needed. Patient used this medication for cold.      BP 145/91  Pulse 120  Temp(Src) 98.7 F (37.1 C) (Oral)  Resp 20  Ht 5\' 10"  (1.778 m)  Wt 232 lb (105.235 kg)  BMI 33.29 kg/m2  SpO2 97%  Physical Exam  Constitutional: He is oriented to person, place, and time. He appears well-developed and well-nourished.  HENT:  Head: Normocephalic and atraumatic.  Right Ear: External ear normal.  Left Ear: External ear normal.  Nose: Nose normal.  Mouth/Throat: Oropharynx is clear and moist.  Eyes: Conjunctivae and EOM are normal. Pupils are equal, round, and  reactive to light.  Neck: Normal range of motion. Neck supple.  Cardiovascular: Normal rate and normal heart sounds.   Pulmonary/Chest: Effort normal and breath sounds normal.  Abdominal: Soft.  Musculoskeletal: Normal range of motion.  Neurological: He is alert and oriented to person, place, and time. He has normal reflexes.  Skin: Skin is warm.  Psychiatric: He has a normal mood and affect.    ED Course  Procedures (including critical care time)  Labs Reviewed  CBC - Abnormal; Notable for the following:    RBC 4.16 (*)    Hemoglobin 11.6 (*)    HCT 33.6 (*)    Platelets 526 (*)    All other components within normal limits  DIFFERENTIAL - Abnormal; Notable for the following:    Neutrophils Relative 34 (*)    Monocytes Relative  21 (*)    nRBC 2 (*)    Monocytes Absolute 1.5 (*)    All other components within normal limits  COMPREHENSIVE METABOLIC PANEL - Abnormal; Notable for the following:    Sodium 134 (*)    Chloride 95 (*)    Glucose, Bld 148 (*)    GFR calc non Af Amer 79 (*)    All other components within normal limits  URINALYSIS, ROUTINE W REFLEX MICROSCOPIC - Abnormal; Notable for the following:    Color, Urine AMBER (*) BIOCHEMICALS MAY BE AFFECTED BY COLOR   Protein, ur 30 (*)    All other components within normal limits  URINE MICROSCOPIC-ADD ON - Abnormal; Notable for the following:    Bacteria, UA FEW (*)    All other components within normal limits   Dg Chest 2 View  12/30/2011  *RADIOLOGY REPORT*  Clinical Data: Chest pain.  Cough.  Hodgkin's lymphoma.  CHEST - 2 VIEW  Comparison: 12/28/2011  Findings: Left midlung scarring and elevation of left hemidiaphragm are stable.  No evidence of acute infiltrate or pulmonary edema. No evidence of pleural effusion.  Heart size is normal.  No mass or lymphadenopathy identified.  Surgical clips are again seen in the left anterior mediastinum.  Right-sided power port remains in appropriate position.  IMPRESSION: Stable left lung scarring.  No active disease.  Original Report Authenticated By: Danae Orleans, M.D.     No diagnosis found.    MDM  Pt given IV NS x 2 liters,  Labs obtained,  Chest xray normal,   Pt given an albuterol neb,   Pt reports he feel much better.  Pt request cough medication.  Pt given Rx for tussionex.   Pt advised to follow up with Dr. Myna Hidalgo on Monday        Langston Masker, [place] 12/30/11 Paulo Fruit  Langston Masker, [place] 12/30/11 2252

## 2011-12-30 NOTE — ED Provider Notes (Signed)
31 year old male with history of Hodgkin's disease who recently had a negative pet scan has been having difficulty with a cough and dyspnea and night sweats for the last week. He had been diagnosed with pneumonia 5 weeks ago and had improved after treatment. Chest x-ray 2 days ago did not show evidence of new infiltrates. He feels better after an albuterol nebulizer treatment. Examination following his nebulizer treatment shows no rales or wheezes or rhonchi, but he does have a slightly prolonged exhalation phase. He is still mildly tachypneic at rest. Chest x-ray will be repeated as will CBC. He is currently receiving chemotherapy for his Hodgkin's disease.  Dione Booze, MD 12/30/11 [phone]

## 2011-12-30 NOTE — Discharge Instructions (Signed)

## 2011-12-30 NOTE — ED Provider Notes (Signed)
Medical screening examination/treatment/procedure(s) were conducted as a shared visit with non-physician practitioner(s) and myself.  I personally evaluated the patient during the encounter   Dione Booze, MD 12/30/11 [phone]

## 2011-12-30 NOTE — ED Notes (Signed)
Pt. Is in no distress and has IVFs going for the second liter.  Pt. Aware of why he is still here.

## 2012-01-02 ENCOUNTER — Ambulatory Visit (HOSPITAL_BASED_OUTPATIENT_CLINIC_OR_DEPARTMENT_OTHER): Payer: Self-pay | Admitting: Hematology & Oncology

## 2012-01-02 ENCOUNTER — Ambulatory Visit (HOSPITAL_BASED_OUTPATIENT_CLINIC_OR_DEPARTMENT_OTHER): Payer: Self-pay

## 2012-01-02 ENCOUNTER — Other Ambulatory Visit (HOSPITAL_BASED_OUTPATIENT_CLINIC_OR_DEPARTMENT_OTHER): Payer: Self-pay | Admitting: Lab

## 2012-01-02 VITALS — BP 146/86 | HR 92 | Temp 97.1°F | Ht 70.0 in | Wt 233.0 lb

## 2012-01-02 DIAGNOSIS — C8192 Hodgkin lymphoma, unspecified, intrathoracic lymph nodes: Secondary | ICD-10-CM

## 2012-01-02 DIAGNOSIS — C8119 Nodular sclerosis classical Hodgkin lymphoma, extranodal and solid organ sites: Secondary | ICD-10-CM

## 2012-01-02 DIAGNOSIS — Z5111 Encounter for antineoplastic chemotherapy: Secondary | ICD-10-CM

## 2012-01-02 LAB — COMPREHENSIVE METABOLIC PANEL
ALT: 15 U/L (ref 0–53)
AST: 34 U/L (ref 0–37)
Chloride: 101 mEq/L (ref 96–112)
Creatinine, Ser: 1.16 mg/dL (ref 0.50–1.35)
Sodium: 139 mEq/L (ref 135–145)
Total Bilirubin: 0.3 mg/dL (ref 0.3–1.2)

## 2012-01-02 LAB — CBC WITH DIFFERENTIAL (CANCER CENTER ONLY)
BASO#: 0.2 10*3/uL (ref 0.0–0.2)
Eosinophils Absolute: 0.3 10*3/uL (ref 0.0–0.5)
HGB: 11.1 g/dL — ABNORMAL LOW (ref 13.0–17.1)
LYMPH#: 3.4 10*3/uL — ABNORMAL HIGH (ref 0.9–3.3)
MCH: 27.8 pg — ABNORMAL LOW (ref 28.0–33.4)
MONO#: 1.8 10*3/uL — ABNORMAL HIGH (ref 0.1–0.9)
MONO%: 9.9 % (ref 0.0–13.0)
NEUT#: 12.5 10*3/uL — ABNORMAL HIGH (ref 1.5–6.5)
Platelets: 581 10*3/uL — ABNORMAL HIGH (ref 145–400)
RBC: 4 10*6/uL — ABNORMAL LOW (ref 4.20–5.70)
WBC: 18.3 10*3/uL — ABNORMAL HIGH (ref 4.0–10.0)

## 2012-01-02 LAB — TECHNOLOGIST REVIEW CHCC SATELLITE

## 2012-01-02 LAB — LACTATE DEHYDROGENASE: LDH: 326 U/L — ABNORMAL HIGH (ref 94–250)

## 2012-01-02 MED ORDER — SODIUM CHLORIDE 0.9 % IV SOLN
Freq: Once | INTRAVENOUS | Status: AC
  Administered 2012-01-02: 10:00:00 via INTRAVENOUS

## 2012-01-02 MED ORDER — VINBLASTINE SULFATE CHEMO INJECTION 1 MG/ML
5.4000 mg/m2 | Freq: Once | INTRAVENOUS | Status: AC
  Administered 2012-01-02: 13 mg via INTRAVENOUS
  Filled 2012-01-02: qty 13

## 2012-01-02 MED ORDER — SODIUM CHLORIDE 0.9 % IV SOLN
10.0000 [IU]/m2 | Freq: Once | INTRAVENOUS | Status: AC
  Administered 2012-01-02: 23 [IU] via INTRAVENOUS
  Filled 2012-01-02: qty 7.7

## 2012-01-02 MED ORDER — SODIUM CHLORIDE 0.9 % IJ SOLN
10.0000 mL | INTRAMUSCULAR | Status: DC | PRN
  Administered 2012-01-02: 10 mL
  Filled 2012-01-02: qty 10

## 2012-01-02 MED ORDER — HEPARIN SOD (PORK) LOCK FLUSH 100 UNIT/ML IV SOLN
500.0000 [IU] | Freq: Once | INTRAVENOUS | Status: AC | PRN
  Administered 2012-01-02: 500 [IU]
  Filled 2012-01-02: qty 5

## 2012-01-02 MED ORDER — DOXORUBICIN HCL CHEMO IV INJECTION 2 MG/ML
25.0000 mg/m2 | Freq: Once | INTRAVENOUS | Status: AC
  Administered 2012-01-02: 58 mg via INTRAVENOUS
  Filled 2012-01-02: qty 29

## 2012-01-02 MED ORDER — DEXAMETHASONE SODIUM PHOSPHATE 4 MG/ML IJ SOLN
20.0000 mg | Freq: Once | INTRAMUSCULAR | Status: AC
  Administered 2012-01-02: 20 mg via INTRAVENOUS

## 2012-01-02 MED ORDER — DACARBAZINE 200 MG IV SOLR
375.0000 mg/m2 | Freq: Once | INTRAVENOUS | Status: AC
  Administered 2012-01-02: 880 mg via INTRAVENOUS
  Filled 2012-01-02: qty 44

## 2012-01-02 MED ORDER — ONDANSETRON 16 MG/50ML IVPB (CHCC)
16.0000 mg | Freq: Once | INTRAVENOUS | Status: AC
  Administered 2012-01-02: 16 mg via INTRAVENOUS
  Filled 2012-01-02: qty 16

## 2012-01-02 NOTE — Patient Instructions (Signed)
[facility] Discharge Instructions for Patients Receiving Chemotherapy  Today you received the following chemotherapy agents Adriamycin, Velban, Dacarbazine and Bleomycin To help prevent nausea and vomiting after your treatment, we encourage you to take your nausea medication Phenergan po every 6 hours prn nausea and vomiting   If you develop nausea and vomiting that is not controlled by your nausea medication, call the clinic. If it is after clinic hours your family physician or the after hours number for the clinic or go to the Emergency Department.   BELOW ARE SYMPTOMS THAT SHOULD BE REPORTED IMMEDIATELY:  *FEVER GREATER THAN 100.5 F  *CHILLS WITH OR WITHOUT FEVER  NAUSEA AND VOMITING THAT IS NOT CONTROLLED WITH YOUR NAUSEA MEDICATION  *UNUSUAL SHORTNESS OF BREATH  *UNUSUAL BRUISING OR BLEEDING  TENDERNESS IN MOUTH AND THROAT WITH OR WITHOUT PRESENCE OF ULCERS  *URINARY PROBLEMS  *BOWEL PROBLEMS  UNUSUAL RASH Items with * indicate a potential emergency and should be followed up as soon as possible.  One of the nurses will contact you 24 hours after your treatment. Please let the nurse know about any problems that you may have experienced. Feel free to call the clinic you have any questions or concerns. The clinic phone number is [phone].   I have been informed and understand all the instructions given to me. I know to contact the clinic, my physician, or go to the Emergency Department if any problems should occur. I do not have any questions at this time, but understand that I may call the clinic during office hours   should I have any questions or need assistance in obtaining follow up care.    __________________________________________  _____________  __________ Signature of Patient or Authorized Representative            Date                   Time    __________________________________________ Nurse's Signature

## 2012-01-02 NOTE — Progress Notes (Signed)
This office note has been dictated.
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
## 2012-01-16 ENCOUNTER — Other Ambulatory Visit: Payer: Self-pay | Admitting: Lab

## 2012-01-16 ENCOUNTER — Ambulatory Visit: Payer: Self-pay

## 2012-01-16 ENCOUNTER — Ambulatory Visit (HOSPITAL_BASED_OUTPATIENT_CLINIC_OR_DEPARTMENT_OTHER): Payer: Self-pay | Admitting: Hematology & Oncology

## 2012-01-16 ENCOUNTER — Other Ambulatory Visit (HOSPITAL_BASED_OUTPATIENT_CLINIC_OR_DEPARTMENT_OTHER): Payer: Self-pay | Admitting: Lab

## 2012-01-16 ENCOUNTER — Ambulatory Visit (HOSPITAL_BASED_OUTPATIENT_CLINIC_OR_DEPARTMENT_OTHER): Payer: Self-pay

## 2012-01-16 VITALS — BP 143/81 | HR 78 | Temp 97.1°F | Ht 70.0 in | Wt 231.0 lb

## 2012-01-16 DIAGNOSIS — C8192 Hodgkin lymphoma, unspecified, intrathoracic lymph nodes: Secondary | ICD-10-CM

## 2012-01-16 DIAGNOSIS — C8119 Nodular sclerosis classical Hodgkin lymphoma, extranodal and solid organ sites: Secondary | ICD-10-CM

## 2012-01-16 DIAGNOSIS — C8190 Hodgkin lymphoma, unspecified, unspecified site: Secondary | ICD-10-CM

## 2012-01-16 DIAGNOSIS — Z5111 Encounter for antineoplastic chemotherapy: Secondary | ICD-10-CM

## 2012-01-16 LAB — MANUAL DIFFERENTIAL (CHCC SATELLITE)
ALC: 2 10*3/uL (ref 0.9–3.3)
ANC (CHCC HP manual diff): 0.8 10*3/uL — ABNORMAL LOW (ref 1.5–6.5)
Band Neutrophils: 2 % (ref 0–10)
Eos: 8 % — ABNORMAL HIGH (ref 0–7)
PLT EST ~~LOC~~: ADEQUATE
SEG: 18 % — ABNORMAL LOW (ref 40–75)

## 2012-01-16 LAB — CBC WITH DIFFERENTIAL (CANCER CENTER ONLY)
MCV: 86 fL (ref 82–98)
Platelets: 406 10*3/uL — ABNORMAL HIGH (ref 145–400)
RDW: 17.5 % — ABNORMAL HIGH (ref 11.1–15.7)
WBC: 4.1 10*3/uL (ref 4.0–10.0)

## 2012-01-16 MED ORDER — SODIUM CHLORIDE 0.9 % IJ SOLN
10.0000 mL | INTRAMUSCULAR | Status: DC | PRN
  Administered 2012-01-16: 10 mL
  Filled 2012-01-16: qty 10

## 2012-01-16 MED ORDER — DEXAMETHASONE SODIUM PHOSPHATE 4 MG/ML IJ SOLN
20.0000 mg | Freq: Once | INTRAMUSCULAR | Status: AC
  Administered 2012-01-16: 20 mg via INTRAVENOUS

## 2012-01-16 MED ORDER — VINBLASTINE SULFATE CHEMO INJECTION 1 MG/ML
5.4000 mg/m2 | Freq: Once | INTRAVENOUS | Status: AC
  Administered 2012-01-16: 13 mg via INTRAVENOUS
  Filled 2012-01-16: qty 13

## 2012-01-16 MED ORDER — SODIUM CHLORIDE 0.9 % IV SOLN
10.0000 [IU]/m2 | Freq: Once | INTRAVENOUS | Status: AC
  Administered 2012-01-16: 23 [IU] via INTRAVENOUS
  Filled 2012-01-16: qty 7.7

## 2012-01-16 MED ORDER — SODIUM CHLORIDE 0.9 % IV SOLN
375.0000 mg/m2 | Freq: Once | INTRAVENOUS | Status: AC
  Administered 2012-01-16: 880 mg via INTRAVENOUS
  Filled 2012-01-16: qty 44

## 2012-01-16 MED ORDER — HEPARIN SOD (PORK) LOCK FLUSH 100 UNIT/ML IV SOLN
500.0000 [IU] | Freq: Once | INTRAVENOUS | Status: AC | PRN
  Administered 2012-01-16: 500 [IU]
  Filled 2012-01-16: qty 5

## 2012-01-16 MED ORDER — DOXORUBICIN HCL CHEMO IV INJECTION 2 MG/ML
25.0000 mg/m2 | Freq: Once | INTRAVENOUS | Status: AC
  Administered 2012-01-16: 58 mg via INTRAVENOUS
  Filled 2012-01-16: qty 29

## 2012-01-16 MED ORDER — COLD PACK MISC ONCOLOGY
1.0000 | Freq: Once | Status: DC | PRN
  Filled 2012-01-16: qty 1

## 2012-01-16 MED ORDER — SODIUM CHLORIDE 0.9 % IV SOLN
Freq: Once | INTRAVENOUS | Status: AC
  Administered 2012-01-16: 13:00:00 via INTRAVENOUS

## 2012-01-16 MED ORDER — ONDANSETRON 16 MG/50ML IVPB (CHCC)
16.0000 mg | Freq: Once | INTRAVENOUS | Status: AC
  Administered 2012-01-16: 16 mg via INTRAVENOUS

## 2012-01-16 NOTE — Progress Notes (Signed)
DIAGNOSIS:  Stage IIA nodular sclerosing Hodgkin disease.  CURRENT THERAPY:  The patient to complete his 3rd cycle of ABVD today.  INTERVAL HISTORY:  [name] comes in for his followup.  He is doing quite well.  He really has had no other complaints outside just being tired.  He is trying to cut back on smoking.  He is not complaining of any shortness of breath.  He has had no diarrhea.  He has had no fever, sweats, or chills.  He has had no rashes.  He has had no diarrhea.  His last PET scan that what we did after his 2nd cycle showed resolution of his Hodgkin disease.  PHYSICAL EXAMINATION:  General Appearance:  This is a well-developed, well-nourished, white gentleman in no obvious distress.  Vital Signs: 97.1, pulse 78, respiratory rate 18, blood pressure 143/81.  Weight is 231.  Head and Neck Exam:  A normocephalic, atraumatic skull.  There are no ocular or oral lesions.  There are no palpable cervical or supraclavicular lymph nodes.  Lungs:  Clear bilaterally.  Cardiac Exam: Regular rate and rhythm with a normal S1 and S2.  There are no murmurs, rubs, or bruits.  Abdominal Exam:  Soft with good bowel sounds.  There is no palpable abdominal mass.  There is no palpable hepatosplenomegaly. Axillary Exam:  No bilateral axillary adenopathy.  Extremities:  No clubbing, cyanosis, or edema.  Neurological Exam:  No focal neurological deficits.  LABORATORY STUDIES:  White cell count 4.2, hemoglobin 11.2, hematocrit 33.6, platelet count 406.  IMPRESSION:  [name] is a 31 year old gentleman with stage IIA nodular sclerosing Hodgkin disease.  He is doing quite well.  He has responded as expected.  I still feel a total of 4 cycles of chemo is all that we are going to need for him.  His disease was found asymptomatically.  He will finish his third cycle today.  We will then get him back in April for his fourth and final cycle of treatment.    ______________________________ Josph Macho, M.D. PRE/MEDQ  D:  01/16/2012  T:  01/16/2012  Job:  [id number]

## 2012-01-16 NOTE — Progress Notes (Signed)
This office note has been dictated.

## 2012-01-30 ENCOUNTER — Ambulatory Visit: Payer: Self-pay

## 2012-01-30 ENCOUNTER — Other Ambulatory Visit (HOSPITAL_BASED_OUTPATIENT_CLINIC_OR_DEPARTMENT_OTHER): Payer: Self-pay | Admitting: Lab

## 2012-01-30 ENCOUNTER — Other Ambulatory Visit: Payer: Self-pay | Admitting: Lab

## 2012-01-30 ENCOUNTER — Ambulatory Visit: Payer: Self-pay | Admitting: Hematology & Oncology

## 2012-02-03 ENCOUNTER — Telehealth: Payer: Self-pay | Admitting: Hematology & Oncology

## 2012-02-03 NOTE — Telephone Encounter (Signed)
Pt called and wife walked into office today.  Per nurse ok to resch 01/30/12 missed apt for 02/06/12.  Wife was give apt date/time.  Pt was given apt date/time over the phone.

## 2012-02-06 ENCOUNTER — Ambulatory Visit (HOSPITAL_BASED_OUTPATIENT_CLINIC_OR_DEPARTMENT_OTHER): Payer: Self-pay | Admitting: Hematology & Oncology

## 2012-02-06 ENCOUNTER — Ambulatory Visit (HOSPITAL_BASED_OUTPATIENT_CLINIC_OR_DEPARTMENT_OTHER): Payer: Self-pay

## 2012-02-06 ENCOUNTER — Other Ambulatory Visit (HOSPITAL_BASED_OUTPATIENT_CLINIC_OR_DEPARTMENT_OTHER): Payer: Self-pay | Admitting: Lab

## 2012-02-06 VITALS — BP 142/89 | HR 86 | Temp 96.1°F | Ht 70.0 in | Wt 232.0 lb

## 2012-02-06 DIAGNOSIS — Z5111 Encounter for antineoplastic chemotherapy: Secondary | ICD-10-CM

## 2012-02-06 DIAGNOSIS — Z452 Encounter for adjustment and management of vascular access device: Secondary | ICD-10-CM

## 2012-02-06 DIAGNOSIS — C8192 Hodgkin lymphoma, unspecified, intrathoracic lymph nodes: Secondary | ICD-10-CM

## 2012-02-06 LAB — COMPREHENSIVE METABOLIC PANEL
ALT: 9 U/L (ref 0–53)
AST: 18 U/L (ref 0–37)
Albumin: 4.5 g/dL (ref 3.5–5.2)
Alkaline Phosphatase: 59 U/L (ref 39–117)
BUN: 16 mg/dL (ref 6–23)
Potassium: 4.1 mEq/L (ref 3.5–5.3)
Sodium: 138 mEq/L (ref 135–145)

## 2012-02-06 LAB — CBC WITH DIFFERENTIAL (CANCER CENTER ONLY)
BASO#: 0.2 10*3/uL (ref 0.0–0.2)
EOS%: 1.8 % (ref 0.0–7.0)
HGB: 12 g/dL — ABNORMAL LOW (ref 13.0–17.1)
MCH: 29.6 pg (ref 28.0–33.4)
MCHC: 33.1 g/dL (ref 32.0–35.9)
MONO%: 8.6 % (ref 0.0–13.0)
NEUT#: 10.4 10*3/uL — ABNORMAL HIGH (ref 1.5–6.5)
NEUT%: 71.3 % (ref 40.0–80.0)

## 2012-02-06 MED ORDER — SODIUM CHLORIDE 0.9 % IV SOLN
10.0000 [IU]/m2 | Freq: Once | INTRAVENOUS | Status: AC
  Administered 2012-02-06: 23 [IU] via INTRAVENOUS
  Filled 2012-02-06: qty 7.7

## 2012-02-06 MED ORDER — DEXAMETHASONE SODIUM PHOSPHATE 4 MG/ML IJ SOLN
20.0000 mg | Freq: Once | INTRAMUSCULAR | Status: AC
  Administered 2012-02-06: 20 mg via INTRAVENOUS

## 2012-02-06 MED ORDER — DOXORUBICIN HCL CHEMO IV INJECTION 2 MG/ML
25.0000 mg/m2 | Freq: Once | INTRAVENOUS | Status: AC
  Administered 2012-02-06: 58 mg via INTRAVENOUS
  Filled 2012-02-06: qty 29

## 2012-02-06 MED ORDER — ALTEPLASE 2 MG IJ SOLR
2.0000 mg | Freq: Once | INTRAMUSCULAR | Status: AC | PRN
  Administered 2012-02-06: 2 mg
  Filled 2012-02-06: qty 2

## 2012-02-06 MED ORDER — SODIUM CHLORIDE 0.9 % IV SOLN
375.0000 mg/m2 | Freq: Once | INTRAVENOUS | Status: AC
  Administered 2012-02-06: 880 mg via INTRAVENOUS
  Filled 2012-02-06: qty 44

## 2012-02-06 MED ORDER — HEPARIN SOD (PORK) LOCK FLUSH 100 UNIT/ML IV SOLN
500.0000 [IU] | Freq: Once | INTRAVENOUS | Status: AC | PRN
  Administered 2012-02-06: 500 [IU]
  Filled 2012-02-06: qty 5

## 2012-02-06 MED ORDER — SODIUM CHLORIDE 0.9 % IJ SOLN
10.0000 mL | INTRAMUSCULAR | Status: DC | PRN
  Administered 2012-02-06: 10 mL
  Filled 2012-02-06: qty 10

## 2012-02-06 MED ORDER — SODIUM CHLORIDE 0.9 % IV SOLN
Freq: Once | INTRAVENOUS | Status: AC
  Administered 2012-02-06: 13:00:00 via INTRAVENOUS

## 2012-02-06 MED ORDER — VINBLASTINE SULFATE CHEMO INJECTION 1 MG/ML
5.4000 mg/m2 | Freq: Once | INTRAVENOUS | Status: AC
  Administered 2012-02-06: 13 mg via INTRAVENOUS
  Filled 2012-02-06: qty 13

## 2012-02-06 MED ORDER — ONDANSETRON 16 MG/50ML IVPB (CHCC)
16.0000 mg | Freq: Once | INTRAVENOUS | Status: AC
  Administered 2012-02-06: 16 mg via INTRAVENOUS

## 2012-02-06 NOTE — Progress Notes (Signed)
This office note has been dictated.
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
## 2012-02-13 ENCOUNTER — Other Ambulatory Visit: Payer: Self-pay | Admitting: Lab

## 2012-02-13 ENCOUNTER — Ambulatory Visit: Payer: Self-pay | Admitting: Hematology & Oncology

## 2012-02-13 ENCOUNTER — Ambulatory Visit: Payer: Self-pay

## 2012-02-20 ENCOUNTER — Ambulatory Visit (HOSPITAL_BASED_OUTPATIENT_CLINIC_OR_DEPARTMENT_OTHER): Payer: Self-pay

## 2012-02-20 ENCOUNTER — Other Ambulatory Visit (HOSPITAL_BASED_OUTPATIENT_CLINIC_OR_DEPARTMENT_OTHER): Payer: Self-pay | Admitting: Lab

## 2012-02-20 VITALS — BP 151/82 | HR 76 | Temp 96.7°F

## 2012-02-20 DIAGNOSIS — C8119 Nodular sclerosis classical Hodgkin lymphoma, extranodal and solid organ sites: Secondary | ICD-10-CM

## 2012-02-20 DIAGNOSIS — C8192 Hodgkin lymphoma, unspecified, intrathoracic lymph nodes: Secondary | ICD-10-CM

## 2012-02-20 DIAGNOSIS — Z5111 Encounter for antineoplastic chemotherapy: Secondary | ICD-10-CM

## 2012-02-20 LAB — COMPREHENSIVE METABOLIC PANEL
ALT: 12 U/L (ref 0–53)
AST: 19 U/L (ref 0–37)
Alkaline Phosphatase: 44 U/L (ref 39–117)
CO2: 25 mEq/L (ref 19–32)
Sodium: 139 mEq/L (ref 135–145)
Total Bilirubin: 0.4 mg/dL (ref 0.3–1.2)
Total Protein: 6.7 g/dL (ref 6.0–8.3)

## 2012-02-20 LAB — CBC WITH DIFFERENTIAL (CANCER CENTER ONLY)
BASO#: 0.2 10*3/uL (ref 0.0–0.2)
HCT: 36.7 % — ABNORMAL LOW (ref 38.7–49.9)
HGB: 11.9 g/dL — ABNORMAL LOW (ref 13.0–17.1)
LYMPH#: 1.8 10*3/uL (ref 0.9–3.3)
LYMPH%: 44.3 % (ref 14.0–48.0)
MCV: 91 fL (ref 82–98)
MONO#: 0.7 10*3/uL (ref 0.1–0.9)
NEUT%: 20.1 % — ABNORMAL LOW (ref 40.0–80.0)
WBC: 4 10*3/uL (ref 4.0–10.0)

## 2012-02-20 LAB — TECHNOLOGIST REVIEW CHCC SATELLITE

## 2012-02-20 MED ORDER — DOXORUBICIN HCL CHEMO IV INJECTION 2 MG/ML
25.0000 mg/m2 | Freq: Once | INTRAVENOUS | Status: AC
  Administered 2012-02-20: 58 mg via INTRAVENOUS
  Filled 2012-02-20: qty 29

## 2012-02-20 MED ORDER — SODIUM CHLORIDE 0.9 % IJ SOLN
10.0000 mL | INTRAMUSCULAR | Status: DC | PRN
  Administered 2012-02-20: 10 mL
  Filled 2012-02-20: qty 10

## 2012-02-20 MED ORDER — HEPARIN SOD (PORK) LOCK FLUSH 100 UNIT/ML IV SOLN
500.0000 [IU] | Freq: Once | INTRAVENOUS | Status: AC | PRN
  Administered 2012-02-20: 500 [IU]
  Filled 2012-02-20: qty 5

## 2012-02-20 MED ORDER — ONDANSETRON 16 MG/50ML IVPB (CHCC)
16.0000 mg | Freq: Once | INTRAVENOUS | Status: AC
  Administered 2012-02-20: 16 mg via INTRAVENOUS

## 2012-02-20 MED ORDER — DEXAMETHASONE SODIUM PHOSPHATE 4 MG/ML IJ SOLN
20.0000 mg | Freq: Once | INTRAMUSCULAR | Status: AC
  Administered 2012-02-20: 20 mg via INTRAVENOUS

## 2012-02-20 MED ORDER — SODIUM CHLORIDE 0.9 % IV SOLN
375.0000 mg/m2 | Freq: Once | INTRAVENOUS | Status: AC
  Administered 2012-02-20: 880 mg via INTRAVENOUS
  Filled 2012-02-20: qty 44

## 2012-02-20 MED ORDER — SODIUM CHLORIDE 0.9 % IV SOLN: Freq: Once | INTRAVENOUS | Status: DC

## 2012-02-20 MED ORDER — VINBLASTINE SULFATE CHEMO INJECTION 1 MG/ML
5.4000 mg/m2 | Freq: Once | INTRAVENOUS | Status: AC
  Administered 2012-02-20: 13 mg via INTRAVENOUS
  Filled 2012-02-20: qty 13

## 2012-02-20 MED ORDER — SODIUM CHLORIDE 0.9 % IV SOLN
10.0000 [IU]/m2 | Freq: Once | INTRAVENOUS | Status: AC
  Administered 2012-02-20: 23 [IU] via INTRAVENOUS
  Filled 2012-02-20: qty 7.7

## 2012-02-27 ENCOUNTER — Other Ambulatory Visit: Payer: Self-pay | Admitting: Lab

## 2012-02-27 ENCOUNTER — Ambulatory Visit: Payer: Self-pay

## 2012-02-27 ENCOUNTER — Ambulatory Visit: Payer: Self-pay | Admitting: Hematology & Oncology

## 2012-03-12 ENCOUNTER — Other Ambulatory Visit (HOSPITAL_COMMUNITY): Payer: Self-pay

## 2012-03-12 ENCOUNTER — Ambulatory Visit: Payer: Self-pay

## 2012-03-12 ENCOUNTER — Other Ambulatory Visit: Payer: Self-pay | Admitting: Lab

## 2012-03-12 ENCOUNTER — Ambulatory Visit: Payer: Self-pay | Admitting: Hematology & Oncology

## 2012-03-14 ENCOUNTER — Telehealth: Payer: Self-pay | Admitting: Hematology & Oncology

## 2012-03-14 NOTE — Telephone Encounter (Signed)
Pt called rescheduled missed PET to 5-22, he is aware of NPO 6 hrs

## 2012-03-21 ENCOUNTER — Ambulatory Visit (HOSPITAL_BASED_OUTPATIENT_CLINIC_OR_DEPARTMENT_OTHER)
Admission: RE | Admit: 2012-03-21 | Discharge: 2012-03-21 | Disposition: A | Discharge status: Home / Self Care | Payer: Self-pay | Source: Ambulatory Visit | Attending: Hematology & Oncology | Admitting: Hematology & Oncology

## 2012-03-21 ENCOUNTER — Ambulatory Visit (HOSPITAL_BASED_OUTPATIENT_CLINIC_OR_DEPARTMENT_OTHER): Payer: Self-pay

## 2012-03-21 VITALS — BP 158/112 | HR 80 | Temp 97.4°F

## 2012-03-21 DIAGNOSIS — J9859 Other diseases of mediastinum, not elsewhere classified: Secondary | ICD-10-CM

## 2012-03-21 DIAGNOSIS — C8192 Hodgkin lymphoma, unspecified, intrathoracic lymph nodes: Secondary | ICD-10-CM

## 2012-03-21 DIAGNOSIS — C8119 Nodular sclerosis classical Hodgkin lymphoma, extranodal and solid organ sites: Secondary | ICD-10-CM

## 2012-03-21 DIAGNOSIS — Z452 Encounter for adjustment and management of vascular access device: Secondary | ICD-10-CM

## 2012-03-21 MED ORDER — FLUDEOXYGLUCOSE F - 18 (FDG) INJECTION: 15.7000 | Freq: Once | INTRAVENOUS | Status: AC | PRN

## 2012-03-21 NOTE — Progress Notes (Signed)
Pt was accessed for his PET scan and went to Radiology approx 0920 with instructions to return to have his port flushed & deaccessed. He verbalized understanding of these instructions.  Received a call approx 1 - 1.5 hours later from the radiology tech stating that the pt's port was "hot" and he was unable to return to the office to have his needle removed. Asked her to further explain what that meant and she stated that there was radioactive dye still around where he was injected. She wanted to know if she could remove the port needle herself. Asked if she was a Engineer, civil (consulting) and she stated "no". Then asked if she had been trained to work access/deaccess ports and she again stated "no". Instructed her to please flush his line with 20 cc of saline and have the pt return to the office so an RN could remove his needle. She stated she would.  The pt never returned to the office. Attempted to call radiology but was unable to reach anyone around 4pm. Called the pt at 7 pm to ask him who removed his needle and he said "someone from the ER did".   Gave him the results of his PET scan (negative) per Dr Myna Hidalgo with verbalized understanding.

## 2012-03-27 ENCOUNTER — Other Ambulatory Visit: Payer: Self-pay | Admitting: Lab

## 2012-03-27 ENCOUNTER — Ambulatory Visit: Payer: Self-pay

## 2012-03-27 ENCOUNTER — Ambulatory Visit (HOSPITAL_BASED_OUTPATIENT_CLINIC_OR_DEPARTMENT_OTHER): Payer: Self-pay | Admitting: Hematology & Oncology

## 2012-03-27 VITALS — BP 159/101 | HR 60 | Temp 96.6°F | Ht 69.0 in | Wt 231.0 lb

## 2012-03-27 DIAGNOSIS — C8119 Nodular sclerosis classical Hodgkin lymphoma, extranodal and solid organ sites: Secondary | ICD-10-CM

## 2012-03-27 DIAGNOSIS — C8190 Hodgkin lymphoma, unspecified, unspecified site: Secondary | ICD-10-CM

## 2012-03-27 LAB — CBC WITH DIFFERENTIAL (CANCER CENTER ONLY)
BASO%: 1.9 % (ref 0.0–2.0)
EOS%: 9.7 % — ABNORMAL HIGH (ref 0.0–7.0)
MCH: 29.9 pg (ref 28.0–33.4)
MCHC: 33 g/dL (ref 32.0–35.9)
MONO%: 8.6 % (ref 0.0–13.0)
NEUT#: 4.4 10*3/uL (ref 1.5–6.5)
Platelets: 212 10*3/uL (ref 145–400)
RDW: 15.8 % — ABNORMAL HIGH (ref 11.1–15.7)

## 2012-03-27 LAB — COMPREHENSIVE METABOLIC PANEL
Alkaline Phosphatase: 41 U/L (ref 39–117)
BUN: 12 mg/dL (ref 6–23)
CO2: 26 mEq/L (ref 19–32)
Creatinine, Ser: 1.1 mg/dL (ref 0.50–1.35)
Glucose, Bld: 72 mg/dL (ref 70–99)
Sodium: 139 mEq/L (ref 135–145)
Total Bilirubin: 0.3 mg/dL (ref 0.3–1.2)
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
## 2012-03-27 NOTE — Progress Notes (Signed)
This office note has been dictated.

## 2012-05-08 ENCOUNTER — Ambulatory Visit (HOSPITAL_BASED_OUTPATIENT_CLINIC_OR_DEPARTMENT_OTHER): Payer: Self-pay

## 2012-05-08 VITALS — BP 157/99 | HR 90 | Temp 97.3°F

## 2012-05-08 DIAGNOSIS — Z452 Encounter for adjustment and management of vascular access device: Secondary | ICD-10-CM

## 2012-05-08 DIAGNOSIS — C8192 Hodgkin lymphoma, unspecified, intrathoracic lymph nodes: Secondary | ICD-10-CM

## 2012-05-08 DIAGNOSIS — C8119 Nodular sclerosis classical Hodgkin lymphoma, extranodal and solid organ sites: Secondary | ICD-10-CM

## 2012-05-08 MED ORDER — HEPARIN SOD (PORK) LOCK FLUSH 100 UNIT/ML IV SOLN
500.0000 [IU] | Freq: Once | INTRAVENOUS | Status: AC | PRN
  Administered 2012-05-08: 500 [IU] via INTRAVENOUS
  Filled 2012-05-08: qty 5

## 2012-05-08 MED ORDER — SODIUM CHLORIDE 0.9 % IJ SOLN
10.0000 mL | INTRAMUSCULAR | Status: DC | PRN
  Administered 2012-05-08: 10 mL via INTRAVENOUS
  Filled 2012-05-08: qty 10

## 2012-05-08 MED ORDER — ALTEPLASE 2 MG IJ SOLR
2.0000 mg | Freq: Once | INTRAMUSCULAR | Status: DC | PRN
  Filled 2012-05-08: qty 2

## 2012-05-12 ENCOUNTER — Other Ambulatory Visit: Payer: Self-pay | Admitting: Physician Assistant

## 2012-06-01 ENCOUNTER — Other Ambulatory Visit: Payer: Self-pay | Admitting: *Deleted

## 2012-06-01 DIAGNOSIS — I10 Essential (primary) hypertension: Secondary | ICD-10-CM

## 2012-06-01 MED ORDER — METOPROLOL TARTRATE 25 MG PO TABS: 25.0000 mg | ORAL_TABLET | Freq: Two times a day (BID) | ORAL | Status: DC

## 2012-06-01 NOTE — Telephone Encounter (Signed)
Pt called stating he needs a metoprolol refill. Reviewed with Dr. Myna Hidalgo. Will refill this time only but then MUST obtain a primary care MD. Dr. Myna Hidalgo said he would be willing to refer him to one. He would like him to do that and understands this is a 1 time fill.

## 2012-06-19 ENCOUNTER — Other Ambulatory Visit: Payer: Self-pay | Admitting: Lab

## 2012-06-19 ENCOUNTER — Ambulatory Visit: Payer: Self-pay | Admitting: Hematology & Oncology

## 2012-06-19 ENCOUNTER — Ambulatory Visit: Payer: Self-pay

## 2012-06-19 NOTE — Progress Notes (Signed)
Pt did not show up for his appts.

## 2012-06-20 ENCOUNTER — Telehealth: Payer: Self-pay | Admitting: Hematology & Oncology

## 2012-06-20 NOTE — Telephone Encounter (Signed)
Pt made 9-3 appointment

## 2012-06-20 NOTE — Telephone Encounter (Signed)
Left message for pt to call and reschedule missed appointment °

## 2012-07-03 ENCOUNTER — Ambulatory Visit (HOSPITAL_BASED_OUTPATIENT_CLINIC_OR_DEPARTMENT_OTHER): Payer: Self-pay | Admitting: Hematology & Oncology

## 2012-07-03 ENCOUNTER — Ambulatory Visit (HOSPITAL_BASED_OUTPATIENT_CLINIC_OR_DEPARTMENT_OTHER): Payer: Self-pay

## 2012-07-03 ENCOUNTER — Other Ambulatory Visit (HOSPITAL_BASED_OUTPATIENT_CLINIC_OR_DEPARTMENT_OTHER): Payer: Self-pay | Admitting: Lab

## 2012-07-03 VITALS — BP 176/110 | HR 66 | Temp 97.6°F | Resp 22 | Ht 69.0 in | Wt 225.0 lb

## 2012-07-03 DIAGNOSIS — C8192 Hodgkin lymphoma, unspecified, intrathoracic lymph nodes: Secondary | ICD-10-CM

## 2012-07-03 DIAGNOSIS — C8119 Nodular sclerosis classical Hodgkin lymphoma, extranodal and solid organ sites: Secondary | ICD-10-CM

## 2012-07-03 DIAGNOSIS — C8110 Nodular sclerosis classical Hodgkin lymphoma, unspecified site: Secondary | ICD-10-CM

## 2012-07-03 DIAGNOSIS — Z452 Encounter for adjustment and management of vascular access device: Secondary | ICD-10-CM

## 2012-07-03 LAB — CBC WITH DIFFERENTIAL (CANCER CENTER ONLY)
BASO%: 1.3 % (ref 0.0–2.0)
LYMPH#: 2.1 10*3/uL (ref 0.9–3.3)
MONO#: 0.7 10*3/uL (ref 0.1–0.9)
NEUT#: 4.1 10*3/uL (ref 1.5–6.5)
Platelets: 262 10*3/uL (ref 145–400)
RDW: 13.7 % (ref 11.1–15.7)
WBC: 7.4 10*3/uL (ref 4.0–10.0)

## 2012-07-03 MED ORDER — HEPARIN SOD (PORK) LOCK FLUSH 100 UNIT/ML IV SOLN
500.0000 [IU] | Freq: Once | INTRAVENOUS | Status: AC
  Administered 2012-07-03: 500 [IU] via INTRAVENOUS
  Filled 2012-07-03: qty 5

## 2012-07-03 MED ORDER — SODIUM CHLORIDE 0.9 % IJ SOLN
10.0000 mL | INTRAMUSCULAR | Status: DC | PRN
  Administered 2012-07-03: 10 mL via INTRAVENOUS
  Filled 2012-07-03: qty 10

## 2012-07-03 NOTE — Patient Instructions (Signed)
STOP SMOKING!!!!!! 

## 2012-07-03 NOTE — Progress Notes (Signed)
This office note has been dictated.
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
## 2012-07-03 NOTE — Progress Notes (Signed)
Kevin Reese presented for Portacath access and flush. Proper placement of portacath confirmed by CXR. Portacath located in the right chest wall accessed with  H 20 needle. Clean, Dry and Intact Good blood return present. Portacath flushed with 20ml NS and 500U/5ml Heparin per protocol and needle removed intact. Procedure without incident. Patient tolerated procedure well.   

## 2012-07-06 LAB — COMPREHENSIVE METABOLIC PANEL
ALT: 8 U/L (ref 0–53)
AST: 16 U/L (ref 0–37)
CO2: 29 mEq/L (ref 19–32)
Calcium: 9.3 mg/dL (ref 8.4–10.5)
Chloride: 101 mEq/L (ref 96–112)
Sodium: 137 mEq/L (ref 135–145)
Total Protein: 7.4 g/dL (ref 6.0–8.3)

## 2012-07-06 LAB — LACTATE DEHYDROGENASE: LDH: 149 U/L (ref 94–250)

## 2012-08-14 ENCOUNTER — Ambulatory Visit (HOSPITAL_BASED_OUTPATIENT_CLINIC_OR_DEPARTMENT_OTHER): Payer: Self-pay | Admitting: Medical

## 2012-08-14 ENCOUNTER — Other Ambulatory Visit: Payer: Self-pay | Admitting: Lab

## 2012-08-14 ENCOUNTER — Other Ambulatory Visit: Payer: Self-pay | Admitting: Medical

## 2012-08-14 ENCOUNTER — Ambulatory Visit: Payer: Self-pay

## 2012-08-14 VITALS — BP 188/134

## 2012-08-14 VITALS — BP 165/113 | HR 72 | Temp 97.7°F | Resp 18 | Ht 71.0 in | Wt 228.0 lb

## 2012-08-14 DIAGNOSIS — C8119 Nodular sclerosis classical Hodgkin lymphoma, extranodal and solid organ sites: Secondary | ICD-10-CM

## 2012-08-14 DIAGNOSIS — I10 Essential (primary) hypertension: Secondary | ICD-10-CM

## 2012-08-14 DIAGNOSIS — C8192 Hodgkin lymphoma, unspecified, intrathoracic lymph nodes: Secondary | ICD-10-CM

## 2012-08-14 MED ORDER — SODIUM CHLORIDE 0.9 % IJ SOLN
10.0000 mL | INTRAMUSCULAR | Status: DC | PRN
  Administered 2012-08-14: 10 mL via INTRAVENOUS
  Filled 2012-08-14: qty 10

## 2012-08-14 MED ORDER — AMLODIPINE BESY-BENAZEPRIL HCL 10-20 MG PO CAPS: 1.0000 | ORAL_CAPSULE | Freq: Every day | ORAL | Status: DC

## 2012-08-14 MED ORDER — HEPARIN SOD (PORK) LOCK FLUSH 100 UNIT/ML IV SOLN
500.0000 [IU] | Freq: Once | INTRAVENOUS | Status: AC | PRN
  Administered 2012-08-14: 500 [IU] via INTRAVENOUS
  Filled 2012-08-14: qty 5

## 2012-08-14 NOTE — Progress Notes (Addendum)
Diagnosis: Stage II a nodular sclerosing Hodgkin's disease.  Current therapy: Observation.  Interim history: Kevin Reese presents today for an office followup visit.  Overall, he, reports, that he's been doing quite well.  He completed his chemotherapy.  Back in April, 2013.  His last PET scan in may was clean, with no evidence of active lymphoma.  He, reports, that he taper down on his smoking.  He is only smoking 2 cigarettes a day, and plans on quitting.  I did speak to him about the left current cigarette.  However, he, reports, that he just wants to stop smoking altogether.  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  This does not interfere with his activities of daily living.  He, he, reports, that he has a good appetite.  He denies any nausea, vomiting, any diarrhea, constipation.  He denies any cough, chest pain, or shortness of breath.  He denies any fevers, chills, or night sweats.  He denies any palpable lymph nodes.  He denies any headaches, visual changes, or rashes.  He denies any obvious, or abnormal bleeding.  Kevin Reese isn't working full-time and trying to obtain some kind of insurance.  He will go ahead and get his Port-A-Cath flushed today. Of note, Kevin Reese, blood pressure is quite high today.  He is on metoprolol 25 mg daily.  However, that is not keeping his blood pressure under control.  I advised Kevin Reese to get a primary care physician as soon as possible so his blood pressure and medication can be monitored.  Review of Systems: Pt. Denies any changes in their vision, hearing, adenopathy, fevers, chills, nausea, vomiting, diarrhea, constipation, chest pain, shortness of breath, passing blood, passing out, blacking out,  any changes in skin, joints, neurologic or psychiatric except as noted.  Physical Exam: This is a pleasant, 31 year old, well-developed, well-nourished, white gentleman, in no obvious distress. Vitals: Temperature 97.7 degrees, pulse  72, respirations 18, blood pressure 165/113.  Weight 228 pounds HEENT reveals a normocephalic, atraumatic skull, no scleral icterus, no oral lesions  Neck is supple without any cervical or supraclavicular adenopathy.  Lungs are clear to auscultation bilaterally. There are no wheezes, rales or rhonci Cardiac is regular rate and rhythm with a normal S1 and S2. There are no murmurs, rubs, or bruits.  Abdomen is soft with good bowel sounds, there is no palpable mass. There is no palpable hepatosplenomegaly. There is no palpable fluid wave.  Musculoskeletal no tenderness of the spine, ribs, or hips.  Extremities there are no clubbing, cyanosis, or edema.  Skin no petechia, purpura or ecchymosis Neurologic is nonfocal.  Laboratory Data: White count 7.4, hemoglobin 15.6, hematocrit 44.8, platelets 262,000  Current Outpatient Prescriptions on File Prior to Visit  Medication Sig Dispense Refill  . clonazePAM (KLONOPIN) 2 MG tablet Take 2 mg by mouth 4 (four) times daily as needed. For anxiety      . metoprolol tartrate (LOPRESSOR) 25 MG tablet Take 1 tablet (25 mg total) by mouth 2 (two) times daily.  60 tablet  0  . promethazine (PHENERGAN) 25 MG tablet Take 1 tablet (25 mg total) by mouth every 6 (six) hours as needed for nausea.  60 tablet  0  . DISCONTD: metoprolol tartrate (LOPRESSOR) 25 MG tablet Take 2 tablets (50 mg total) by mouth 2 (two) times daily.  60 tablet  2   Current Facility-Administered Medications on File Prior to Visit  Medication Dose Route Frequency Provider Last Rate Last  Dose  . sodium chloride 0.9 % injection 10 mL  10 mL Intracatheter PRN Josph Macho, MD   10 mL at 11/07/11 1415   Assessment/Plan: This is a pleasant, 31 year old, white gentleman, with the following issues:  #1 stage II a nodular sclerosing Hodgkin's disease.  He received 4 cycles of ABVD.  His PET scan.  Back on may, 22nd 2013, revealed no evidence of lymphoma recurrence in the neck, chest, abdomen or  pelvis.  For now, we will plan for routine followup without any scans unless he has symptoms.  #2.  We will continue with every 6 week.  Port-A-Cath flushes.  The plan is to keep his Port-A-Cath in for a total of one year , which should be until April of 2014.  #3 followup.  We will follow back up with, Kevin Reese in 3 months, but before then should there be questions or concerns.   Addendum:  Hypertension-I did notice that Kevin Reese's, blood pressure is extremely high.  He, reports, that he is on metoprolol 25 mg once a day.  This obviously is not keeping his blood pressure well-controlled.  I'm going to give him a new antihypertensive in the form of Lotrel.  I advised him that he needs to get a primary care physician ASAP who will be able to monitor his blood pressure and medication.

## 2012-08-14 NOTE — Patient Instructions (Signed)

## 2012-08-14 NOTE — Progress Notes (Signed)
Eunice Blase PA informed of patients BP.  She discussed this situation with patient and medications were changed

## 2012-09-25 ENCOUNTER — Ambulatory Visit (HOSPITAL_BASED_OUTPATIENT_CLINIC_OR_DEPARTMENT_OTHER): Payer: Self-pay

## 2012-09-25 VITALS — BP 131/68 | HR 71 | Temp 97.0°F | Resp 20

## 2012-09-25 DIAGNOSIS — C8192 Hodgkin lymphoma, unspecified, intrathoracic lymph nodes: Secondary | ICD-10-CM

## 2012-09-25 DIAGNOSIS — Z452 Encounter for adjustment and management of vascular access device: Secondary | ICD-10-CM

## 2012-09-25 DIAGNOSIS — C8119 Nodular sclerosis classical Hodgkin lymphoma, extranodal and solid organ sites: Secondary | ICD-10-CM

## 2012-09-25 MED ORDER — ALTEPLASE 2 MG IJ SOLR
2.0000 mg | Freq: Once | INTRAMUSCULAR | Status: DC | PRN
  Filled 2012-09-25: qty 2

## 2012-09-25 MED ORDER — HEPARIN SOD (PORK) LOCK FLUSH 100 UNIT/ML IV SOLN
500.0000 [IU] | Freq: Once | INTRAVENOUS | Status: AC | PRN
  Administered 2012-09-25: 500 [IU] via INTRAVENOUS
  Filled 2012-09-25: qty 5

## 2012-09-25 MED ORDER — SODIUM CHLORIDE 0.9 % IJ SOLN
10.0000 mL | INTRAMUSCULAR | Status: DC | PRN
  Administered 2012-09-25: 10 mL via INTRAVENOUS
  Filled 2012-09-25: qty 10

## 2012-09-25 NOTE — Patient Instructions (Signed)

## 2012-11-06 ENCOUNTER — Other Ambulatory Visit (HOSPITAL_BASED_OUTPATIENT_CLINIC_OR_DEPARTMENT_OTHER): Payer: Self-pay | Admitting: Lab

## 2012-11-06 ENCOUNTER — Ambulatory Visit (HOSPITAL_BASED_OUTPATIENT_CLINIC_OR_DEPARTMENT_OTHER): Payer: Self-pay | Admitting: Medical

## 2012-11-06 ENCOUNTER — Ambulatory Visit: Payer: Self-pay

## 2012-11-06 VITALS — BP 115/56 | HR 78 | Temp 97.5°F | Resp 18 | Ht 71.0 in | Wt 220.0 lb

## 2012-11-06 DIAGNOSIS — C8192 Hodgkin lymphoma, unspecified, intrathoracic lymph nodes: Secondary | ICD-10-CM

## 2012-11-06 DIAGNOSIS — C8119 Nodular sclerosis classical Hodgkin lymphoma, extranodal and solid organ sites: Secondary | ICD-10-CM

## 2012-11-06 DIAGNOSIS — I10 Essential (primary) hypertension: Secondary | ICD-10-CM

## 2012-11-06 LAB — COMPREHENSIVE METABOLIC PANEL
ALT: <8 U/L (ref 0–53)
AST: 12 U/L (ref 0–37)
Calcium: 9.4 mg/dL (ref 8.4–10.5)
Chloride: 102 mEq/L (ref 96–112)
Creatinine, Ser: 1.23 mg/dL (ref 0.50–1.35)

## 2012-11-06 LAB — CBC WITH DIFFERENTIAL (CANCER CENTER ONLY)
BASO%: 0.8 % (ref 0.0–2.0)
EOS%: 3.8 % (ref 0.0–7.0)
HCT: 42.6 % (ref 38.7–49.9)
LYMPH#: 2.7 10*3/uL (ref 0.9–3.3)
MCHC: 34 g/dL (ref 32.0–35.9)
NEUT#: 5.8 10*3/uL (ref 1.5–6.5)
NEUT%: 60.2 % (ref 40.0–80.0)
Platelets: 294 10*3/uL (ref 145–400)
RDW: 13.2 % (ref 11.1–15.7)

## 2012-11-06 MED ORDER — HEPARIN SOD (PORK) LOCK FLUSH 100 UNIT/ML IV SOLN
500.0000 [IU] | Freq: Once | INTRAVENOUS | Status: AC | PRN
  Administered 2012-11-06: 500 [IU] via INTRAVENOUS
  Filled 2012-11-06: qty 5

## 2012-11-06 MED ORDER — SODIUM CHLORIDE 0.9 % IJ SOLN
10.0000 mL | INTRAMUSCULAR | Status: DC | PRN
  Administered 2012-11-06 (×2): 10 mL via INTRAVENOUS
  Filled 2012-11-06: qty 10

## 2012-11-06 MED ORDER — ALTEPLASE 2 MG IJ SOLR
2.0000 mg | Freq: Once | INTRAMUSCULAR | Status: DC | PRN
  Filled 2012-11-06: qty 2

## 2012-11-06 NOTE — Progress Notes (Signed)
Diagnosis: Stage II a nodular sclerosing Hodgkin's disease.  Current therapy: Observation.  Interim history: Kevin Reese presents today for an office followup visit.  Overall, he, reports, that he's been doing quite well.  He completed his chemotherapy back in April, 2013.  His last PET scan in May was clean, with no evidence of active lymphoma.  He, reports, that he taper down on his smoking.  He is only smoking 2 cigarettes a day, and plans on quitting.  I did speak to him about the electronic cigarette.  However, he, reports, that he just wants to stop smoking altogether.  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  This does not interfere with his activities of daily living.  He, he, reports, that he has a good appetite.  He denies any nausea, vomiting, any diarrhea, constipation.  He denies any cough, chest pain, or shortness of breath.  He denies any fevers, chills, or night sweats.  He denies any palpable lymph nodes.  He denies any headaches, visual changes, or rashes.  He denies any obvious, or abnormal bleeding.  His blood pressure is significantly better today than on his prior visit.  Last, visit.  I did prescribe Lotrel and referred him to primary care physician.  He still has not followed up with any primary care physician.  I explained to him that this is extremely important to that.  They can give him a good overall, physical and monitor any type of medical issues he may have. He will go ahead and get his Port-A-Cath flushed today.  Review of Systems: Constitutional:Negative for malaise/fatigue, fever, chills, weight loss, diaphoresis, activity change, appetite change, and unexpected weight change.  HEENT: Negative for double vision, blurred vision, visual loss, ear pain, tinnitus, congestion, rhinorrhea, epistaxis sore throat or sinus disease, oral pain/lesion, tongue soreness Respiratory: Negative for cough, chest tightness, shortness of breath, wheezing and  stridor.  Cardiovascular: Negative for chest pain, palpitations, leg swelling, orthopnea, PND, DOE or claudication Gastrointestinal: Negative for nausea, vomiting, abdominal pain, diarrhea, constipation, blood in stool, melena, hematochezia, abdominal distention, anal bleeding, rectal pain, anorexia and hematemesis.  Genitourinary: Negative for dysuria, frequency, hematuria,  Musculoskeletal: Negative for myalgias, back pain, joint swelling, arthralgias and gait problem.  Skin: Negative for rash, color change, pallor and wound.  Neurological:. Negative for dizziness/light-headedness, tremors, seizures, syncope, facial asymmetry, speech difficulty, weakness, numbness, headaches and paresthesias.  Hematological: Negative for adenopathy. Does not bruise/bleed easily.  Psychiatric/Behavioral:  Negative for depression, no loss of interest in normal activity or change in sleep pattern.   Physical Exam: This is a pleasant, 32 year old, well-developed, well-nourished, white gentleman, in no obvious distress. Vitals: Temperature 97.5 degrees, pulse 78, respirations 18, blood pressure 115/56, weight 222 pounds HEENT reveals a normocephalic, atraumatic skull, no scleral icterus, no oral lesions  Neck is supple without any cervical or supraclavicular adenopathy.  Lungs are clear to auscultation bilaterally. There are no wheezes, rales or rhonci Cardiac is regular rate and rhythm with a normal S1 and S2. There are no murmurs, rubs, or bruits.  Abdomen is soft with good bowel sounds, there is no palpable mass. There is no palpable hepatosplenomegaly. There is no palpable fluid wave.  Musculoskeletal no tenderness of the spine, ribs, or hips.  Extremities there are no clubbing, cyanosis, or edema.  Skin no petechia, purpura or ecchymosis Neurologic is nonfocal.  Laboratory Data: White count 9.7, hemoglobin 14.5, hematocrit 42.6, platelets 294,000  Current Outpatient Prescriptions on File Prior to Visit  Medication Sig Dispense Refill  . amLODipine-benazepril (LOTREL) 10-20 MG per capsule Take 1 capsule by mouth daily.  30 capsule  3  . clonazePAM (KLONOPIN) 2 MG tablet Take 2 mg by mouth 4 (four) times daily as needed. For anxiety       Assessment/Plan: This is a pleasant, 32 year old, white gentleman, with the following issues:  #1 stage II a nodular sclerosing Hodgkin's disease.  He received 4 cycles of ABVD.  His PET scan.  Back on may, 22nd 2013, revealed no evidence of lymphoma recurrence in the neck, chest, abdomen or pelvis.  For now, we will plan for routine followup without any scans unless he has symptoms.  He is currently asymptomatic and is doing quite well.  #2.  Hypertension.  This is significantly  better compared to his last visit.  He is on Lotrel.  I still advised him to find a primary care physician.  #3.  We will continue with every 6 week.  Port-A-Cath flushes.  The plan is to keep his Port-A-Cath in for a total of one year , which should be until April of 2014.  #4  followup.  We will follow back up with, Kevin Reese in 3 months, but before then should there be questions or concerns.

## 2012-11-06 NOTE — Patient Instructions (Signed)

## 2012-11-06 NOTE — Progress Notes (Signed)
Kevin Reese presented for Portacath access and flush. Proper placement of portacath confirmed by CXR. Portacath located in the right chest wall accessed with  H 20 needle. Clean, Dry and Intact Good blood return present. Portacath flushed with 20ml NS and 500U/5ml Heparin per protocol and needle removed intact. Procedure without incident. Patient tolerated procedure well.   

## 2012-11-26 ENCOUNTER — Ambulatory Visit: Payer: Self-pay | Admitting: Family

## 2012-11-28 ENCOUNTER — Ambulatory Visit: Payer: Self-pay | Admitting: Family

## 2012-12-17 ENCOUNTER — Telehealth: Payer: Self-pay | Admitting: Hematology & Oncology

## 2012-12-17 NOTE — Telephone Encounter (Signed)
Called pt to reschedule 2-18 flush he said he would call me back.

## 2012-12-18 ENCOUNTER — Ambulatory Visit (HOSPITAL_BASED_OUTPATIENT_CLINIC_OR_DEPARTMENT_OTHER): Payer: Self-pay

## 2012-12-18 VITALS — BP 112/67 | HR 77 | Temp 97.4°F | Resp 18

## 2012-12-18 DIAGNOSIS — Z452 Encounter for adjustment and management of vascular access device: Secondary | ICD-10-CM

## 2012-12-18 DIAGNOSIS — C8119 Nodular sclerosis classical Hodgkin lymphoma, extranodal and solid organ sites: Secondary | ICD-10-CM

## 2012-12-18 DIAGNOSIS — C8192 Hodgkin lymphoma, unspecified, intrathoracic lymph nodes: Secondary | ICD-10-CM

## 2012-12-18 MED ORDER — SODIUM CHLORIDE 0.9 % IJ SOLN
10.0000 mL | INTRAMUSCULAR | Status: DC | PRN
  Administered 2012-12-18: 10 mL via INTRAVENOUS
  Filled 2012-12-18: qty 10

## 2012-12-18 MED ORDER — HEPARIN SOD (PORK) LOCK FLUSH 100 UNIT/ML IV SOLN
500.0000 [IU] | Freq: Once | INTRAVENOUS | Status: AC | PRN
  Administered 2012-12-18: 500 [IU] via INTRAVENOUS
  Filled 2012-12-18: qty 5

## 2012-12-18 NOTE — Patient Instructions (Signed)

## 2012-12-18 NOTE — Progress Notes (Signed)
Kevin Reese presented for Portacath access and flush. Proper placement of portacath confirmed by CXR. Portacath located in the right chest wall accessed with  H 20 needle. Clean, Dry and Intact Good blood return present. Portacath flushed with 20ml NS and 500U/72ml Heparin per protocol and needle removed intact. Procedure without incident. Patient tolerated procedure well.

## 2013-01-11 ENCOUNTER — Other Ambulatory Visit: Payer: Self-pay | Admitting: *Deleted

## 2013-01-11 DIAGNOSIS — I10 Essential (primary) hypertension: Secondary | ICD-10-CM

## 2013-01-11 MED ORDER — AMLODIPINE BESY-BENAZEPRIL HCL 10-20 MG PO CAPS: 1.0000 | ORAL_CAPSULE | Freq: Every day | ORAL | Status: DC

## 2013-01-11 NOTE — Telephone Encounter (Signed)
Pt called and states he has appt with PCP for 01/29/13.  Asked if we could given him enough blood pressure medication till 01/29/13.  Medication called to pharmacy.  Pt aware.

## 2013-01-29 ENCOUNTER — Other Ambulatory Visit (HOSPITAL_BASED_OUTPATIENT_CLINIC_OR_DEPARTMENT_OTHER): Payer: Self-pay | Admitting: Lab

## 2013-01-29 ENCOUNTER — Ambulatory Visit: Payer: Self-pay

## 2013-01-29 ENCOUNTER — Ambulatory Visit (HOSPITAL_BASED_OUTPATIENT_CLINIC_OR_DEPARTMENT_OTHER): Payer: Self-pay | Admitting: Hematology & Oncology

## 2013-01-29 VITALS — BP 129/71 | HR 78 | Temp 97.3°F | Resp 18 | Ht 72.0 in | Wt 215.0 lb

## 2013-01-29 DIAGNOSIS — C8192 Hodgkin lymphoma, unspecified, intrathoracic lymph nodes: Secondary | ICD-10-CM

## 2013-01-29 DIAGNOSIS — C8119 Nodular sclerosis classical Hodgkin lymphoma, extranodal and solid organ sites: Secondary | ICD-10-CM

## 2013-01-29 LAB — CBC WITH DIFFERENTIAL (CANCER CENTER ONLY)
BASO#: 0.1 10*3/uL (ref 0.0–0.2)
EOS%: 3.5 % (ref 0.0–7.0)
LYMPH%: 29.6 % (ref 14.0–48.0)
MCH: 30 pg (ref 28.0–33.4)
MCHC: 33.8 g/dL (ref 32.0–35.9)
MCV: 89 fL (ref 82–98)
MONO%: 7.5 % (ref 0.0–13.0)
NEUT#: 5 10*3/uL (ref 1.5–6.5)
Platelets: 275 10*3/uL (ref 145–400)

## 2013-01-29 LAB — COMPREHENSIVE METABOLIC PANEL
AST: 16 U/L (ref 0–37)
Albumin: 4.6 g/dL (ref 3.5–5.2)
Alkaline Phosphatase: 38 U/L — ABNORMAL LOW (ref 39–117)
BUN: 15 mg/dL (ref 6–23)
Potassium: 3.9 mEq/L (ref 3.5–5.3)
Sodium: 136 mEq/L (ref 135–145)
Total Bilirubin: 0.4 mg/dL (ref 0.3–1.2)

## 2013-01-29 NOTE — Progress Notes (Signed)
CC:   Kevin Reese, M.D.  DIAGNOSIS:  Stage IIA nodular sclerosing Hodgkin disease.  CURRENT THERAPY:  Observation.  INTERIM HISTORY:  Kevin Reese comes in for his followup.  He is doing quite well.  We last saw back in early January.  He has had no complaints at all.  He completed his 4 cycles of ABVD back in April 2013.  His last PET scan did not show any evidence of residual/recurrent disease.  I think we can probably get his Port-A-Cath out now.  It has been a year since he has been in remission.  He is doing okay.  He is working a little bit.  He had no problems over the wintertime.  He has had no nausea or vomiting.  He has had no cough.  He is still smoking, but trying to cut back.  He has lost weight, which is nice to see.  PHYSICAL EXAMINATION:  General:  This is a well-developed, well- nourished white gentleman in no obvious distress.  Vital signs: Temperature of 97.3, pulse 78, respiratory rate 18, blood pressure 129/71.  Weight is 215.  Head and neck:  Normocephalic, atraumatic skull.  There are no ocular or oral lesions.  There are no palpable cervical or supraclavicular lymph nodes.  Lungs:  Clear bilaterally. Cardiac:  Regular rate and rhythm with a normal S1 and S2.  There are no murmurs, rubs, or bruits.  Abdomen:  Soft with good bowel sounds.  There is no palpable abdominal mass.  There is no fluid wave.  There is no palpable hepatosplenomegaly.  Extremities:  No clubbing, cyanosis, or edema.  Neurological:  No focal neurological deficits.  LABORATORY STUDIES:  White cell count is 8.6, hemoglobin 14.6, hematocrit 43.2, platelet count 275.  IMPRESSION:  Kevin Reese is a 32 year old gentleman with stage IIA nodular sclerosing Hodgkin disease.  This was found incidentally when he had a chest x-ray for the fall.  He received 4 cycles of ABVD.  He is noting some neuropathy in his feet.  I told him to try vitamin B6 and baby aspirin.  Again, I will see about  getting his Port-A-Cath out.  Again, it has been a year since he has been treated.  I will plan to get him back in 4 months now.  I do not see need for any kind of PET scans.    ______________________________ Josph Macho, M.D. PRE/MEDQ  D:  01/29/2013  T:  01/29/2013  Job:  [id number]

## 2013-01-29 NOTE — Progress Notes (Signed)
This office note has been dictated.

## 2013-01-29 NOTE — Progress Notes (Signed)
NO port flush needed today per M.D order

## 2013-01-30 ENCOUNTER — Telehealth: Payer: Self-pay | Admitting: Hematology & Oncology

## 2013-01-30 NOTE — Telephone Encounter (Signed)
Pt aware of 4-9 port appointment has Tina's number and is going to call her to reschedule he is going out of town that week

## 2013-01-30 NOTE — Telephone Encounter (Signed)
All 3 of pt's numbers do not have voice mail set-up. Left message on Mother's voice mail to have pt call. When pt calls need to inform him of 4-9 port removal at 630 am [facility] radiology, to be NPO after midnight and he will need a driver

## 2013-01-30 NOTE — Telephone Encounter (Signed)
In[facility] Fermo IR aware of order for port removal and will call pt to schedule appointment

## 2013-02-06 ENCOUNTER — Other Ambulatory Visit (HOSPITAL_COMMUNITY): Payer: Self-pay

## 2013-02-06 ENCOUNTER — Ambulatory Visit (HOSPITAL_COMMUNITY): Payer: Self-pay

## 2013-03-06 ENCOUNTER — Other Ambulatory Visit: Payer: Self-pay | Admitting: Radiology

## 2013-03-08 ENCOUNTER — Encounter (HOSPITAL_COMMUNITY): Payer: Self-pay | Admitting: Pharmacy Technician

## 2013-03-11 ENCOUNTER — Ambulatory Visit (HOSPITAL_COMMUNITY): Admission: RE | Admit: 2013-03-11 | Payer: Self-pay | Source: Ambulatory Visit

## 2013-03-26 ENCOUNTER — Other Ambulatory Visit: Payer: Self-pay | Admitting: Radiology

## 2013-03-29 ENCOUNTER — Ambulatory Visit (HOSPITAL_COMMUNITY): Admission: RE | Admit: 2013-03-29 | Payer: Self-pay | Source: Ambulatory Visit

## 2013-03-29 ENCOUNTER — Encounter (HOSPITAL_COMMUNITY): Payer: Self-pay

## 2013-03-29 ENCOUNTER — Ambulatory Visit (HOSPITAL_COMMUNITY)
Admission: RE | Admit: 2013-03-29 | Discharge: 2013-03-29 | Disposition: A | Discharge status: Home / Self Care | Payer: Self-pay | Source: Ambulatory Visit | Attending: Hematology & Oncology | Admitting: Hematology & Oncology

## 2013-03-29 DIAGNOSIS — Z79899 Other long term (current) drug therapy: Secondary | ICD-10-CM | POA: Insufficient documentation

## 2013-03-29 DIAGNOSIS — Z452 Encounter for adjustment and management of vascular access device: Secondary | ICD-10-CM | POA: Insufficient documentation

## 2013-03-29 DIAGNOSIS — C8190 Hodgkin lymphoma, unspecified, unspecified site: Secondary | ICD-10-CM | POA: Insufficient documentation

## 2013-03-29 DIAGNOSIS — C8192 Hodgkin lymphoma, unspecified, intrathoracic lymph nodes: Secondary | ICD-10-CM

## 2013-03-29 DIAGNOSIS — E119 Type 2 diabetes mellitus without complications: Secondary | ICD-10-CM | POA: Insufficient documentation

## 2013-03-29 DIAGNOSIS — I10 Essential (primary) hypertension: Secondary | ICD-10-CM | POA: Insufficient documentation

## 2013-03-29 LAB — PROTIME-INR
INR: 0.9 (ref 0.00–1.49)
Prothrombin Time: 12.1 seconds (ref 11.6–15.2)

## 2013-03-29 LAB — CBC
MCH: 29.5 pg (ref 26.0–34.0)
MCHC: 33.7 g/dL (ref 30.0–36.0)
Platelets: 298 10*3/uL (ref 150–400)
RDW: 13.4 % (ref 11.5–15.5)

## 2013-03-29 MED ORDER — FENTANYL CITRATE 0.05 MG/ML IJ SOLN
INTRAMUSCULAR | Status: AC
  Administered 2013-03-29: 50 ug
  Filled 2013-03-29: qty 2

## 2013-03-29 MED ORDER — LIDOCAINE HCL 1 % IJ SOLN
INTRAMUSCULAR | Status: AC
  Filled 2013-03-29: qty 20

## 2013-03-29 MED ORDER — VANCOMYCIN HCL IN DEXTROSE 1-5 GM/200ML-% IV SOLN
INTRAVENOUS | Status: AC
  Filled 2013-03-29: qty 200

## 2013-03-29 MED ORDER — FENTANYL CITRATE 0.05 MG/ML IJ SOLN
INTRAMUSCULAR | Status: AC | PRN
  Administered 2013-03-29: 100 ug via INTRAVENOUS

## 2013-03-29 MED ORDER — FENTANYL CITRATE 0.05 MG/ML IJ SOLN
INTRAMUSCULAR | Status: AC
  Filled 2013-03-29: qty 4

## 2013-03-29 MED ORDER — SODIUM CHLORIDE 0.9 % IV SOLN
INTRAVENOUS | Status: DC
  Administered 2013-03-29: 20 mL/h via INTRAVENOUS

## 2013-03-29 MED ORDER — MIDAZOLAM HCL 2 MG/2ML IJ SOLN
INTRAMUSCULAR | Status: AC | PRN
  Administered 2013-03-29: 1 mg via INTRAVENOUS

## 2013-03-29 MED ORDER — MIDAZOLAM HCL 2 MG/2ML IJ SOLN
INTRAMUSCULAR | Status: AC
  Filled 2013-03-29: qty 4

## 2013-03-29 MED ORDER — VANCOMYCIN HCL IN DEXTROSE 1-5 GM/200ML-% IV SOLN: 1000.0000 mg | INTRAVENOUS | Status: DC

## 2013-03-29 NOTE — H&P (Signed)
Chief Complaint: "I'm here for my port removal" Referring Physician:Ennever HPI: Kevin Reese is an 32 y.o. male with hx of Lymphoma who has completed treatment and is referred for port removal. PMHx and meds reviewed. No recent illness or fevers.  Past Medical History:  Past Medical History  Diagnosis Date  . Mediastinal mass   . Hypertension     was on Azor and Amlodipine/Benazepril;was started on these in 2009;came off of these meds a yr ago bc can't afford them  . Heart murmur     diagnosed in high school  . Chronic back pain     hx buldging disc  . Anxiety     takes Klonopin daily  . 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 Cancer   . Pneumonia   . Diabetes mellitus     Pt has tested WNL since wt loss.    Past Surgical History:  Past Surgical History  Procedure Laterality Date  . Back surgery  2009/2011  . Mediastinal mass excision  10/2011    Family History:  Family History  Problem Relation Age of Onset  . Anesthesia problems Neg Hx   . Hypotension Neg Hx   . Malignant hyperthermia Neg Hx   . Pseudochol deficiency Neg Hx     Social History:  reports that he has been smoking Cigarettes.  He has a 6 pack-year smoking history. He has never used smokeless tobacco. He reports that he drinks about 3.6 ounces of alcohol per week. He reports that he uses illicit drugs (Marijuana) about 3 times per week.  Allergies:  Allergies  Allergen Reactions  . Penicillins Rash and Other (See Comments)    Allergic since childhood    Medications: clonazePAM (KLONOPIN) 2 MG tablet (Taking) Sig - Route: Take 2 mg by mouth 4 (four) times daily as needed. For anxiety - Oral Class: Historical Med Number of times this order has been changed since signing: 4 [address] lisinopril (PRINIVIL,ZESTRIL) 5 MG tablet (Taking) Sig - Route: Take 5 mg by mouth every morning. - Oral Class: Historical Med naproxen sodium (ANAPROX) 220 MG tablet (Taking) Sig - Route:  Take 220 mg by mouth 2 (two) times daily as needed (pain   Please HPI for pertinent positives, otherwise complete 10 system ROS negative.  Physical Exam: BP 140/91  Pulse 63  Temp(Src) 98 F (36.7 C) (Oral)  Resp 20  Ht 5\' 10"  (1.778 m)  Wt 200 lb (90.719 kg)  BMI 28.7 kg/m2  SpO2 97% Body mass index is 28.7 kg/(m^2).   General Appearance:  Alert, cooperative, no distress, appears stated age  Head:  Normocephalic, without obvious abnormality, atraumatic  ENT: Unremarkable  Neck: Supple, symmetrical, trachea midline  Lungs:   Clear to auscultation bilaterally, no w/r/r, respirations unlabored without use of accessory muscles.  Chest Wall:  Rt chest wall port palpable, NT  Heart:  Regular rate and rhythm, S1, S2 normal, no murmur, rub or gallop.  Neurologic: Normal affect, no gross deficits.   Results for orders placed during the hospital encounter of 03/29/13 (from the past 48 hour(s))  CBC     Status: None   Collection Time    03/29/13 10:03 AM      Result Value Range   WBC 8.8  4.0 - 10.5 K/uL   RBC 4.85  4.22 - 5.81 MIL/uL   Hemoglobin 14.3  13.0 - 17.0 g/dL   HCT 16.1  09.6 - 04.5 %   MCV 87.4  78.0 - 100.0 fL   MCH 29.5  26.0 - 34.0 pg   MCHC 33.7  30.0 - 36.0 g/dL   RDW 95.6  21.3 - 08.6 %   Platelets 298  150 - 400 K/uL  PROTIME-INR     Status: None   Collection Time    03/29/13 10:03 AM      Result Value Range   Prothrombin Time 12.1  11.6 - 15.2 seconds   INR 0.90  0.00 - 1.49   No results found.  Assessment/Plan Hx Lymphoma Discussed port removal procedure. Risks, complications, recovery restrictions reviewed. Labs reviewed. Consent signed in chart  Brayton El PA-C 03/29/2013, 11:17 AM

## 2013-03-29 NOTE — Procedures (Signed)
Procedure:  Removal of portacath Uncomplicated port removal.  See report.

## 2013-03-29 NOTE — H&P (Signed)
Agree 

## 2013-06-20 ENCOUNTER — Telehealth: Payer: Self-pay | Admitting: Hematology & Oncology

## 2013-06-20 NOTE — Telephone Encounter (Signed)
Pt left message cx 8-25 said would call back to reschedule after he talks to his boss. He is working out of town

## 2013-06-24 ENCOUNTER — Ambulatory Visit: Payer: Self-pay | Admitting: Hematology & Oncology

## 2013-06-24 ENCOUNTER — Other Ambulatory Visit: Payer: Self-pay | Admitting: Lab

## 2013-07-31 ENCOUNTER — Telehealth: Payer: Self-pay | Admitting: Hematology & Oncology

## 2013-07-31 NOTE — Telephone Encounter (Signed)
Pt made 11-17 appointment

## 2013-09-13 ENCOUNTER — Other Ambulatory Visit: Payer: Self-pay | Admitting: *Deleted

## 2013-09-13 DIAGNOSIS — C8192 Hodgkin lymphoma, unspecified, intrathoracic lymph nodes: Secondary | ICD-10-CM

## 2013-09-16 ENCOUNTER — Ambulatory Visit: Payer: Self-pay | Admitting: Hematology & Oncology

## 2013-09-16 ENCOUNTER — Other Ambulatory Visit: Payer: Self-pay | Admitting: Lab

## 2013-09-16 ENCOUNTER — Telehealth: Payer: Self-pay | Admitting: Hematology & Oncology

## 2013-09-16 NOTE — Telephone Encounter (Signed)
Called pt to reschedule 11-17 no show. No answer or voice mail.

## 2013-09-18 ENCOUNTER — Telehealth: Payer: Self-pay | Admitting: Hematology & Oncology

## 2013-09-18 ENCOUNTER — Encounter: Payer: Self-pay | Admitting: Hematology & Oncology

## 2013-09-18 NOTE — Telephone Encounter (Signed)
Pt's phone is still no answer or voice mail. Mailed letter for pt to call to reschedule missed 11-17

## 2013-09-23 ENCOUNTER — Telehealth: Payer: Self-pay | Admitting: Hematology & Oncology

## 2013-09-23 NOTE — Telephone Encounter (Signed)
Pt in town made 11-25 appointment

## 2013-09-24 ENCOUNTER — Other Ambulatory Visit (HOSPITAL_BASED_OUTPATIENT_CLINIC_OR_DEPARTMENT_OTHER): Payer: Self-pay | Admitting: Lab

## 2013-09-24 ENCOUNTER — Ambulatory Visit (HOSPITAL_BASED_OUTPATIENT_CLINIC_OR_DEPARTMENT_OTHER): Payer: Self-pay | Admitting: Hematology & Oncology

## 2013-09-24 VITALS — BP 123/60 | HR 60 | Temp 98.0°F | Resp 18 | Ht 70.0 in | Wt 217.0 lb

## 2013-09-24 DIAGNOSIS — C8192 Hodgkin lymphoma, unspecified, intrathoracic lymph nodes: Secondary | ICD-10-CM

## 2013-09-24 DIAGNOSIS — C8119 Nodular sclerosis classical Hodgkin lymphoma, extranodal and solid organ sites: Secondary | ICD-10-CM

## 2013-09-24 LAB — COMPREHENSIVE METABOLIC PANEL
AST: 12 U/L (ref 0–37)
Alkaline Phosphatase: 39 U/L (ref 39–117)
BUN: 14 mg/dL (ref 6–23)
Glucose, Bld: 158 mg/dL — ABNORMAL HIGH (ref 70–99)
Sodium: 138 mEq/L (ref 135–145)
Total Bilirubin: 0.4 mg/dL (ref 0.3–1.2)
Total Protein: 6.7 g/dL (ref 6.0–8.3)

## 2013-09-24 LAB — CBC WITH DIFFERENTIAL (CANCER CENTER ONLY)
BASO#: 0.1 10*3/uL (ref 0.0–0.2)
BASO%: 0.9 % (ref 0.0–2.0)
EOS%: 2.6 % (ref 0.0–7.0)
HGB: 13.9 g/dL (ref 13.0–17.1)
LYMPH#: 2.7 10*3/uL (ref 0.9–3.3)
MCH: 29.9 pg (ref 28.0–33.4)
MCHC: 33 g/dL (ref 32.0–35.9)
MONO%: 7.5 % (ref 0.0–13.0)
NEUT#: 5.4 10*3/uL (ref 1.5–6.5)
Platelets: 266 10*3/uL (ref 145–400)
RDW: 12.9 % (ref 11.1–15.7)

## 2013-09-24 MED ORDER — VITAMIN B-6 250 MG PO TABS: 250.0000 mg | ORAL_TABLET | Freq: Every day | ORAL | Status: DC

## 2013-09-24 NOTE — Progress Notes (Signed)
This office note has been dictated.

## 2013-09-24 NOTE — Addendum Note (Signed)
Addended by: Arlan Organ R on: 09/24/2013 11:23 AM   Modules accepted: Orders

## 2013-10-05 NOTE — Progress Notes (Signed)
CC:   Delaney Meigs, M.D.  DIAGNOSIS:  Stage IIA nodular sclerosing Hodgkin disease.  CURRENT THERAPY:  Observation.  INTERIM HISTORY:  [name] comes in for his followup.  He is doing quite well.  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  He has had no problems with work.  There is no fatigue.  He is working about 12 hours a day.  He has had no cough or shortness breath.  He has stopped smoking now. It has been 2 weeks since he stopped smoking.  He has had no change in bowel or bladder habits.  He has had no pruritus.  There have been no rashes.  He has had no fevers, sweats, or chills.  PHYSICAL EXAM:  General:  This is a well-developed, well-nourished white gentleman in no obvious distress.  Vital Signs:  Show a temperature of 98, pulse 60, respiratory rate 18, blood pressure 123/60, weight is 217 pounds.  Head and Neck:  Exam shows a normocephalic, atraumatic skull. There are no ocular or oral lesions.  There are no palpable cervical or supraclavicular lymph nodes.  Lungs:  Clear bilaterally.  Cardiac: Regular rate and rhythm with normal S1, S2.  There are no murmurs, rubs, or bruits.  Axillary:  Exam shows no bilateral axillary adenopathy. Abdomen:  Soft.  He has good bowel sounds.  There is no fluid wave. There is no palpable abdominal mass.  There is no palpable hepatosplenomegaly.  Back:  Exam shows the laminectomy scar in the lumbar spine.  This is well healed.  No tenderness is noted over the spine, ribs, or hips.  Extremities:  Show no clubbing, cyanosis, or edema.  He has good strength in his arms and legs.  Skin:  No rashes, ecchymosis, or petechiae.  LABORATORY STUDIES:  White cell count is 9.2, hemoglobin 14, hematocrit 42, platelet count 266.  IMPRESSION:  Mr. Dickison is a very nice 32 year old white gentleman.  He had an incidentally found Hodgkin disease.  He underwent 4 cycles of ABVD.   This was completed back in April of 2013.  For now, I do not see any evidence of recurrent disease.  As such, we do not need to do any scans on him.  We will go ahead and plan to get him back in 6 more months.  I do not see that we need to do any labs in between visits.  Of note, his neuropathy seems to be getting a little bit better.    ______________________________ Josph Macho, M.D. PRE/MEDQ  D:  09/24/2013  T:  10/04/2013  Job:  [id number]

## 2014-02-27 ENCOUNTER — Telehealth: Payer: Self-pay | Admitting: Hematology & Oncology

## 2014-02-27 NOTE — Telephone Encounter (Signed)
Pt called is aware of 5-27 appointment, he was checking on it

## 2014-03-26 ENCOUNTER — Ambulatory Visit (HOSPITAL_BASED_OUTPATIENT_CLINIC_OR_DEPARTMENT_OTHER): Payer: Self-pay | Admitting: Hematology & Oncology

## 2014-03-26 ENCOUNTER — Encounter: Payer: Self-pay | Admitting: Hematology & Oncology

## 2014-03-26 ENCOUNTER — Other Ambulatory Visit (HOSPITAL_BASED_OUTPATIENT_CLINIC_OR_DEPARTMENT_OTHER): Payer: Self-pay | Admitting: Lab

## 2014-03-26 VITALS — BP 180/113 | HR 84 | Temp 97.6°F | Resp 20 | Ht 70.0 in | Wt 238.0 lb

## 2014-03-26 DIAGNOSIS — C8192 Hodgkin lymphoma, unspecified, intrathoracic lymph nodes: Secondary | ICD-10-CM

## 2014-03-26 DIAGNOSIS — C8119 Nodular sclerosis classical Hodgkin lymphoma, extranodal and solid organ sites: Secondary | ICD-10-CM

## 2014-03-26 LAB — CBC WITH DIFFERENTIAL (CANCER CENTER ONLY)
BASO#: 0.1 10*3/uL (ref 0.0–0.2)
BASO%: 1 % (ref 0.0–2.0)
EOS%: 3 % (ref 0.0–7.0)
Eosinophils Absolute: 0.2 10*3/uL (ref 0.0–0.5)
HEMATOCRIT: 45.1 % (ref 38.7–49.9)
HGB: 15.6 g/dL (ref 13.0–17.1)
LYMPH#: 1.7 10*3/uL (ref 0.9–3.3)
LYMPH%: 24.1 % (ref 14.0–48.0)
MCH: 30.2 pg (ref 28.0–33.4)
MCHC: 34.6 g/dL (ref 32.0–35.9)
MCV: 87 fL (ref 82–98)
MONO#: 0.6 10*3/uL (ref 0.1–0.9)
MONO%: 8.2 % (ref 0.0–13.0)
NEUT%: 63.7 % (ref 40.0–80.0)
NEUTROS ABS: 4.5 10*3/uL (ref 1.5–6.5)
PLATELETS: 296 10*3/uL (ref 145–400)
RBC: 5.16 10*6/uL (ref 4.20–5.70)
RDW: 12.7 % (ref 11.1–15.7)
WBC: 7.1 10*3/uL (ref 4.0–10.0)

## 2014-03-26 LAB — CMP (CANCER CENTER ONLY)
ALBUMIN: 3.8 g/dL (ref 3.3–5.5)
ALK PHOS: 51 U/L (ref 26–84)
ALT: 22 U/L (ref 10–47)
AST: 32 U/L (ref 11–38)
BUN, Bld: 9 mg/dL (ref 7–22)
CALCIUM: 8.7 mg/dL (ref 8.0–10.3)
CHLORIDE: 98 meq/L (ref 98–108)
CO2: 29 mEq/L (ref 18–33)
Creat: 1.1 mg/dl (ref 0.6–1.2)
Glucose, Bld: 145 mg/dL — ABNORMAL HIGH (ref 73–118)
POTASSIUM: 3.6 meq/L (ref 3.3–4.7)
SODIUM: 136 meq/L (ref 128–145)
TOTAL PROTEIN: 7.6 g/dL (ref 6.4–8.1)
Total Bilirubin: 0.7 mg/dl (ref 0.20–1.60)

## 2014-03-26 LAB — LACTATE DEHYDROGENASE: LDH: 210 U/L (ref 94–250)

## 2014-03-26 NOTE — Progress Notes (Signed)
03-27-14  BP elevated, taken twice, pt states he did not take his Lisinopril in 3 days.  States he will get a the Rx. Refilled today.

## 2014-03-27 NOTE — Progress Notes (Signed)
Hematology and Oncology Follow Up Visit  Kevin Reese [id number] 1981/08/12 33 y.o. 03/27/2014   Principle Diagnosis:   Stage IIA nodular sclerosing Hodgkin's disease  Current Therapy:    Observation     Interim History:  Mr.  Reese is a for followup. He is doing well. [place] working[place] He's been doing well. He is not smoking. He's having gained some weight.  He's had no chest pain. He's had some sweats but I this might be more related to work.  He's had no rashes. There's been no change in bowel or bladder habits.  Medications: Current outpatient prescriptions:clonazePAM (KLONOPIN) 2 MG tablet, Take 2 mg by mouth 4 (four) times daily as needed. For anxiety, Disp: , Rfl: ;  lisinopril (PRINIVIL,ZESTRIL) 5 MG tablet, Take 5 mg by mouth every morning., Disp: , Rfl: ;  Pyridoxine HCl (VITAMIN B-6) 250 MG tablet, Take 1 tablet (250 mg total) by mouth daily., Disp: 30 tablet, Rfl: 12 No current facility-administered medications for this visit. Facility-Administered Medications Ordered in Other Visits: sodium chloride 0.9 % injection 10 mL, 10 mL, Intracatheter, PRN, Volanda Napoleon, MD, 10 mL at 11/07/11 1415  Allergies:  Allergies  Allergen Reactions  . Penicillins Rash and Other (See Comments)    Allergic since childhood    Past Medical History, Surgical history, Social history, and Family History were reviewed and updated.  Review of Systems: As above  Physical Exam:  height is 5\' 10"  (1.778 m) and weight is 238 lb (107.956 kg). His oral temperature is 97.6 F (36.4 C). His blood pressure is 180/113 and his pulse is 84. His respiration is 20.   Well-developed and well-nourished. Lungs are clear. No lymphadenopathy of his neck. Cardiac exam regular in rhythm. Abdomen soft. No fluid wave. There is no palpable liver or spleen. Exam no tenderness over the spine ribs or its. Extremities shows no clubbing cyanosis or edema. Skin exam no rashes.  Neurological exam is nonfocal.  Lab Results  Component Value Date   WBC 7.1 03/26/2014   HGB 15.6 03/26/2014   HCT 45.1 03/26/2014   MCV 87 03/26/2014   PLT 296 03/26/2014     Chemistry      Component Value Date/Time   NA 136 03/26/2014 0920   NA 138 09/24/2013 1018   K 3.6 03/26/2014 0920   K 3.9 09/24/2013 1018   CL 98 03/26/2014 0920   CL 105 09/24/2013 1018   CO2 29 03/26/2014 0920   CO2 25 09/24/2013 1018   BUN 9 03/26/2014 0920   BUN 14 09/24/2013 1018   CREATININE 1.1 03/26/2014 0920   CREATININE 1.31 09/24/2013 1018      Component Value Date/Time   CALCIUM 8.7 03/26/2014 0920   CALCIUM 9.1 09/24/2013 1018   ALKPHOS 51 03/26/2014 0920   ALKPHOS 39 09/24/2013 1018   AST 32 03/26/2014 0920   AST 12 09/24/2013 1018   ALT 22 03/26/2014 0920   ALT <8 09/24/2013 1018   BILITOT 0.70 03/26/2014 0920   BILITOT 0.4 09/24/2013 1018         Impression and Plan: Kevin Reese is 33 year old gentleman with a history of stage IIA Hodgkin's disease. He was treated with systemic chemotherapy. We gave him for a cycles of chemotherapy and. He completed this back in April of 2013.  I don't see any evidence of recurrence. I really believe that he is cured.  We will go ahead and have him back in another 6  months.  I don't see that we have to do any kind of scans on him.   Volanda Napoleon, MD 5/28/201511:51 AM

## 2014-09-19 ENCOUNTER — Ambulatory Visit (HOSPITAL_BASED_OUTPATIENT_CLINIC_OR_DEPARTMENT_OTHER): Payer: Self-pay | Admitting: Hematology & Oncology

## 2014-09-19 ENCOUNTER — Encounter: Payer: Self-pay | Admitting: Hematology & Oncology

## 2014-09-19 ENCOUNTER — Other Ambulatory Visit: Payer: Self-pay | Admitting: *Deleted

## 2014-09-19 ENCOUNTER — Other Ambulatory Visit (HOSPITAL_BASED_OUTPATIENT_CLINIC_OR_DEPARTMENT_OTHER): Payer: Self-pay | Admitting: Lab

## 2014-09-19 ENCOUNTER — Ambulatory Visit (HOSPITAL_BASED_OUTPATIENT_CLINIC_OR_DEPARTMENT_OTHER): Payer: Self-pay

## 2014-09-19 VITALS — BP 177/116 | HR 85 | Temp 98.2°F | Resp 20 | Ht 71.0 in | Wt 253.0 lb

## 2014-09-19 DIAGNOSIS — M5416 Radiculopathy, lumbar region: Secondary | ICD-10-CM

## 2014-09-19 DIAGNOSIS — C8192 Hodgkin lymphoma, unspecified, intrathoracic lymph nodes: Secondary | ICD-10-CM

## 2014-09-19 DIAGNOSIS — C8119 Nodular sclerosis classical Hodgkin lymphoma, extranodal and solid organ sites: Secondary | ICD-10-CM

## 2014-09-19 DIAGNOSIS — Z23 Encounter for immunization: Secondary | ICD-10-CM

## 2014-09-19 DIAGNOSIS — M5417 Radiculopathy, lumbosacral region: Secondary | ICD-10-CM

## 2014-09-19 LAB — CBC WITH DIFFERENTIAL (CANCER CENTER ONLY)
BASO#: 0.1 10*3/uL (ref 0.0–0.2)
BASO%: 1.5 % (ref 0.0–2.0)
EOS%: 4.3 % (ref 0.0–7.0)
Eosinophils Absolute: 0.3 10*3/uL (ref 0.0–0.5)
HCT: 44.7 % (ref 38.7–49.9)
HGB: 15.2 g/dL (ref 13.0–17.1)
LYMPH#: 2.3 10*3/uL (ref 0.9–3.3)
LYMPH%: 34.1 % (ref 14.0–48.0)
MCH: 30 pg (ref 28.0–33.4)
MCHC: 34 g/dL (ref 32.0–35.9)
MCV: 88 fL (ref 82–98)
MONO#: 0.5 10*3/uL (ref 0.1–0.9)
MONO%: 7 % (ref 0.0–13.0)
NEUT%: 53.1 % (ref 40.0–80.0)
NEUTROS ABS: 3.6 10*3/uL (ref 1.5–6.5)
Platelets: 307 10*3/uL (ref 145–400)
RBC: 5.07 10*6/uL (ref 4.20–5.70)
RDW: 13.2 % (ref 11.1–15.7)
WBC: 6.8 10*3/uL (ref 4.0–10.0)

## 2014-09-19 LAB — CMP (CANCER CENTER ONLY)
ALBUMIN: 3.7 g/dL (ref 3.3–5.5)
ALK PHOS: 43 U/L (ref 26–84)
ALT(SGPT): 23 U/L (ref 10–47)
AST: 30 U/L (ref 11–38)
BILIRUBIN TOTAL: 0.8 mg/dL (ref 0.20–1.60)
BUN: 13 mg/dL (ref 7–22)
CO2: 26 mEq/L (ref 18–33)
Calcium: 8.9 mg/dL (ref 8.0–10.3)
Chloride: 101 mEq/L (ref 98–108)
Creat: 1.3 mg/dl — ABNORMAL HIGH (ref 0.6–1.2)
GLUCOSE: 134 mg/dL — AB (ref 73–118)
POTASSIUM: 4.1 meq/L (ref 3.3–4.7)
SODIUM: 139 meq/L (ref 128–145)
TOTAL PROTEIN: 7.4 g/dL (ref 6.4–8.1)

## 2014-09-19 LAB — LACTATE DEHYDROGENASE: LDH: 171 U/L (ref 94–250)

## 2014-09-19 MED ORDER — KETOROLAC TROMETHAMINE 30 MG/ML IJ SOLN: 30.0000 mg | Freq: Once | INTRAMUSCULAR | Status: DC

## 2014-09-19 MED ORDER — KETOROLAC TROMETHAMINE 15 MG/ML IJ SOLN
INTRAMUSCULAR | Status: AC
  Filled 2014-09-19: qty 2

## 2014-09-19 MED ORDER — CYCLOBENZAPRINE HCL 10 MG PO TABS: 10.0000 mg | ORAL_TABLET | Freq: Three times a day (TID) | ORAL | Status: DC | PRN

## 2014-09-19 MED ORDER — DIAZEPAM 5 MG PO TABS: 5.0000 mg | ORAL_TABLET | Freq: Once | ORAL | Status: DC

## 2014-09-19 MED ORDER — TRAMADOL HCL 50 MG PO TABS: ORAL_TABLET | ORAL | Status: DC

## 2014-09-19 MED ORDER — INFLUENZA VAC SPLIT QUAD 0.5 ML IM SUSY
0.5000 mL | PREFILLED_SYRINGE | Freq: Once | INTRAMUSCULAR | Status: AC
Start: 2014-09-19 — End: 2014-09-19
  Administered 2014-09-19: 0.5 mL via INTRAMUSCULAR
  Filled 2014-09-19: qty 0.5

## 2014-09-19 MED ORDER — KETOROLAC TROMETHAMINE 30 MG/ML IJ SOLN
30.0000 mg | Freq: Once | INTRAMUSCULAR | Status: AC
  Administered 2014-09-19: 30 mg via INTRAMUSCULAR
  Filled 2014-09-19: qty 1

## 2014-09-19 NOTE — Addendum Note (Signed)
Addended by: Cordelia Poche on: 09/19/2014 10:36 AM   Modules accepted: Orders

## 2014-09-19 NOTE — Progress Notes (Signed)
Hematology and Oncology Follow Up Visit  Kevin Reese [id number] 01/12/1981 33 y.o. 09/19/2014   Principle Diagnosis:  Stage IIA nodular sclerosing Hodgkin's disease  Current Therapy:    Observation     Interim History:  Mr.  Reese is back for follow-up. He is inclined to pain. He go back from [place]. He's been working down there.  He says the pain is in his lower back. He goes down his left leg. He has had back surgery before. He has seen with the surgery. He will probably need to see them again. He has seen Kevin Reese in the past. I will go ahead and set up MRI for him. It sounds like he has another herniated disc.  He's had no other process. He's not smoking. He's been doing okay at the work site.  He's had no fever. He's had no cough. He's had no shortness of breath. He's had no nausea or vomiting.  He now has been in remission since April 2013. I suspect that he is cured.  Medications: Current outpatient prescriptions: clonazePAM (KLONOPIN) 2 MG tablet, Take 2 mg by mouth 4 (four) times daily as needed. For anxiety, Disp: , Rfl: ;  lisinopril (PRINIVIL,ZESTRIL) 5 MG tablet, Take 5 mg by mouth every morning., Disp: , Rfl: ;  cyclobenzaprine (FLEXERIL) 10 MG tablet, Take 1 tablet (10 mg total) by mouth 3 (three) times daily as needed for muscle spasms., Disp: 60 tablet, Rfl: 2 diazepam (VALIUM) 5 MG tablet, Take 1 tablet (5 mg total) by mouth once. Prior to MRI for claustrophobia. May repeat times one if ineffective., Disp: 2 tablet, Rfl: 0;  traMADol (ULTRAM) 50 MG tablet, Take 1-2, if needed, every 6 hours for pain., Disp: 90 tablet, Rfl: 2 No current facility-administered medications for this visit. Facility-Administered Medications Ordered in Other Visits: sodium chloride 0.9 % injection 10 mL, 10 mL, Intracatheter, PRN, Volanda Napoleon, MD, 10 mL at 11/07/11 1415  Allergies:  Allergies  Allergen Reactions  . Penicillins Rash and Other (See Comments)    Allergic  since childhood    Past Medical History, Surgical history, Social history, and Family History were reviewed and updated.  Review of Systems: As above  Physical Exam:  height is 5\' 11"  (1.803 m) and weight is 253 lb (114.76 kg). His oral temperature is 98.2 F (36.8 C). His blood pressure is 177/116 and his pulse is 85. His respiration is 20.   Somewhat obese white gentleman. He is in moderate distress secondary to lower back pain. His head exam shows no ocular or oral lesions. He has no palpable cervical or supraclavicular lymph nodes. Lungs are clear. Cardiac exam regular rate and rhythm with no murmurs, rubs or bruits. Abdomen is soft. He has good bowel sounds. There is no fluid wave. There is no palpable liver or spleen tip. Back exam shows no tenderness over the spine, ribs or hips. He does have some tenderness in the lumbosacral spine with some spasms of the paravertebral muscles. Extremities shows no clubbing, cyanosis or edema. He does have a positive straight leg raise of the left leg. Skin exam shows no rashes, ecchymoses or petechia. Neurological exam is nonfocal.  Lab Results  Component Value Date   WBC 6.8 09/19/2014   HGB 15.2 09/19/2014   HCT 44.7 09/19/2014   MCV 88 09/19/2014   PLT 307 09/19/2014     Chemistry      Component Value Date/Time   NA 139 09/19/2014 0924   NA 138 09/24/2013  1018   K 4.1 09/19/2014 0924   K 3.9 09/24/2013 1018   CL 101 09/19/2014 0924   CL 105 09/24/2013 1018   CO2 26 09/19/2014 0924   CO2 25 09/24/2013 1018   BUN 13 09/19/2014 0924   BUN 14 09/24/2013 1018   CREATININE 1.3* 09/19/2014 0924   CREATININE 1.31 09/24/2013 1018      Component Value Date/Time   CALCIUM 8.9 09/19/2014 0924   CALCIUM 9.1 09/24/2013 1018   ALKPHOS 43 09/19/2014 0924   ALKPHOS 39 09/24/2013 1018   AST 30 09/19/2014 0924   AST 12 09/24/2013 1018   ALT 23 09/19/2014 0924   ALT <8 09/24/2013 1018   BILITOT 0.80 09/19/2014 0924   BILITOT 0.4 09/24/2013  1018         Impression and Plan: Kevin Reese is 33 year old gentleman with a history of stage IIa nodular sclerosing Hodgkin's disease. Again, this was found incidentally with a chest x-ray after he fell. He was treated with 4 cycles of chemotherapy with ABVD. He tolerated this well.  We did go ahead and give me intramuscular injection of Toradol. This was 30 mg. I gave him a prescription for some Flexeril. I told him to try some over-the-counter Advil or Aleve.  We will try to get the MRI done this weekend. He will see the orthopedic surgeon next week, hopefully.  I will plan to see him back in another 6 months. I just don't believe this back issue is related to the Hodgkin's disease.  I spent about 30 minutes with him try to help with the back issues. Volanda Napoleon, MD 11/20/201512:43 PM

## 2014-09-19 NOTE — Patient Instructions (Signed)

## 2014-09-20 ENCOUNTER — Ambulatory Visit (HOSPITAL_BASED_OUTPATIENT_CLINIC_OR_DEPARTMENT_OTHER): Payer: Self-pay

## 2014-09-22 ENCOUNTER — Telehealth: Payer: Self-pay | Admitting: Hematology & Oncology

## 2014-09-22 NOTE — Telephone Encounter (Signed)
Left RN message to let MD know pt was no show to 11-21 MRI

## 2014-10-13 ENCOUNTER — Other Ambulatory Visit: Payer: Self-pay | Admitting: Nurse Practitioner

## 2014-11-11 ENCOUNTER — Telehealth: Payer: Self-pay | Admitting: Hematology & Oncology

## 2014-11-11 NOTE — Telephone Encounter (Signed)
Faxed medical records to [place] at [facility].    P: [phone] F: [phone]     COPY SCANNED

## 2014-11-27 ENCOUNTER — Emergency Department (HOSPITAL_COMMUNITY): Payer: 59

## 2014-11-27 ENCOUNTER — Emergency Department (HOSPITAL_COMMUNITY)
Admission: EM | Admit: 2014-11-27 | Discharge: 2014-11-27 | Disposition: A | Discharge status: Home / Self Care | Payer: 59 | Attending: Emergency Medicine | Admitting: Emergency Medicine

## 2014-11-27 ENCOUNTER — Encounter (HOSPITAL_COMMUNITY): Payer: Self-pay | Admitting: Emergency Medicine

## 2014-11-27 DIAGNOSIS — J029 Acute pharyngitis, unspecified: Secondary | ICD-10-CM | POA: Insufficient documentation

## 2014-11-27 DIAGNOSIS — Z87891 Personal history of nicotine dependence: Secondary | ICD-10-CM | POA: Insufficient documentation

## 2014-11-27 DIAGNOSIS — R0789 Other chest pain: Secondary | ICD-10-CM | POA: Insufficient documentation

## 2014-11-27 DIAGNOSIS — E119 Type 2 diabetes mellitus without complications: Secondary | ICD-10-CM | POA: Insufficient documentation

## 2014-11-27 DIAGNOSIS — G8929 Other chronic pain: Secondary | ICD-10-CM | POA: Diagnosis not present

## 2014-11-27 DIAGNOSIS — Z8701 Personal history of pneumonia (recurrent): Secondary | ICD-10-CM | POA: Insufficient documentation

## 2014-11-27 DIAGNOSIS — Z8571 Personal history of Hodgkin lymphoma: Secondary | ICD-10-CM | POA: Insufficient documentation

## 2014-11-27 DIAGNOSIS — I10 Essential (primary) hypertension: Secondary | ICD-10-CM | POA: Diagnosis not present

## 2014-11-27 DIAGNOSIS — Z880 Allergy status to penicillin: Secondary | ICD-10-CM | POA: Diagnosis not present

## 2014-11-27 DIAGNOSIS — F419 Anxiety disorder, unspecified: Secondary | ICD-10-CM | POA: Insufficient documentation

## 2014-11-27 DIAGNOSIS — R0981 Nasal congestion: Secondary | ICD-10-CM | POA: Diagnosis not present

## 2014-11-27 DIAGNOSIS — R059 Cough, unspecified: Secondary | ICD-10-CM

## 2014-11-27 DIAGNOSIS — R52 Pain, unspecified: Secondary | ICD-10-CM | POA: Diagnosis present

## 2014-11-27 DIAGNOSIS — R011 Cardiac murmur, unspecified: Secondary | ICD-10-CM | POA: Insufficient documentation

## 2014-11-27 DIAGNOSIS — R05 Cough: Secondary | ICD-10-CM

## 2014-11-27 MED ORDER — AZITHROMYCIN 250 MG PO TABS: 250.0000 mg | ORAL_TABLET | Freq: Every day | ORAL | Status: DC

## 2014-11-27 MED ORDER — IBUPROFEN 600 MG PO TABS: 600.0000 mg | ORAL_TABLET | Freq: Four times a day (QID) | ORAL | Status: DC | PRN

## 2014-11-27 MED ORDER — ALBUTEROL SULFATE HFA 108 (90 BASE) MCG/ACT IN AERS
2.0000 | INHALATION_SPRAY | Freq: Once | RESPIRATORY_TRACT | Status: AC
  Administered 2014-11-27: 2 via RESPIRATORY_TRACT
  Filled 2014-11-27: qty 6.7

## 2014-11-27 MED ORDER — GUAIFENESIN-CODEINE 100-10 MG/5ML PO SYRP: 10.0000 mL | ORAL_SOLUTION | Freq: Three times a day (TID) | ORAL | Status: DC | PRN

## 2014-11-27 NOTE — ED Notes (Signed)
Patient states "I have medication for my blood pressure, I just did not take it this morning."

## 2014-11-27 NOTE — ED Provider Notes (Signed)
CSN: [id number]     Arrival date & time 11/27/14  1148 History   First MD Initiated Contact with Patient 11/27/14 1310     Chief Complaint  Patient presents with  . Generalized Body Aches     (Consider location/radiation/quality/duration/timing/severity/associated sxs/prior Treatment) HPI   Kevin Reese is a 34 y.o. male who presents to the Emergency Department complaining of cough, nasal congestion and sore throat for 2 weeks.  He states his symptoms began with congestion and cough.  Cough has been mostly non-productive.  Reports tightness and wheezing at times.  Cough seems worse with lying down.  He has taken several OTC medications without relief.  He denies known fever, chills, chest pain, abdominal pain or shortness of breath.    Past Medical History  Diagnosis Date  . Mediastinal mass   . Hypertension     was on Azor and Amlodipine/Benazepril;was started on these in 2009;came off of these meds a yr ago bc can't afford them  . Heart murmur     diagnosed in high school  . Chronic back pain     hx buldging disc  . Anxiety     takes Klonopin daily  . 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 Cancer   . Pneumonia   . Diabetes mellitus     Pt has tested WNL since wt loss.   Past Surgical History  Procedure Laterality Date  . Back surgery  2009/2011  . Mediastinal mass excision  10/2011   Family History  Problem Relation Age of Onset  . Anesthesia problems Neg Hx   . Hypotension Neg Hx   . Malignant hyperthermia Neg Hx   . Pseudochol deficiency Neg Hx    History  Substance Use Topics  . Smoking status: Former Smoker -- 0.50 packs/day for 14 years    Types: Cigarettes    Start date: 10/27/1999    Quit date: 09/26/2013  . Smokeless tobacco: Never Used     Comment: quit 6 months ago  . Alcohol Use: 3.6 oz/week    6 Cans of beer per week    Review of Systems  Constitutional: Negative for fever, chills, activity change and appetite change.   HENT: Positive for congestion, rhinorrhea and sore throat. Negative for facial swelling and trouble swallowing.   Eyes: Negative for visual disturbance.  Respiratory: Positive for cough, chest tightness and wheezing. Negative for shortness of breath and stridor.   Gastrointestinal: Negative for nausea, vomiting, abdominal pain and abdominal distention.  Genitourinary: Negative for dysuria and flank pain.  Musculoskeletal: Negative for neck pain and neck stiffness.  Skin: Negative for rash.  Neurological: Negative for dizziness, weakness, numbness and headaches.  Hematological: Negative for adenopathy.  Psychiatric/Behavioral: Negative for confusion.  All other systems reviewed and are negative.     Allergies  Penicillins  Home Medications   Prior to Admission medications   Medication Sig Start Date End Date Taking? Authorizing Provider  azithromycin (ZITHROMAX) 250 MG tablet Take 1 tablet (250 mg total) by mouth daily. Take first 2 tablets together, then 1 every day until finished. 11/27/14   Paxon Propes L. Daphene Chisholm, PA-C  clonazePAM (KLONOPIN) 2 MG tablet Take 2 mg by mouth 4 (four) times daily as needed. For anxiety    Historical Provider, MD  cyclobenzaprine (FLEXERIL) 10 MG tablet Take 1 tablet (10 mg total) by mouth 3 (three) times daily as needed for muscle spasms. 09/19/14   Volanda Napoleon, MD  diazepam (VALIUM) 5 MG tablet  Take 1 tablet (5 mg total) by mouth once. Prior to MRI for claustrophobia. May repeat times one if ineffective. 09/19/14   Volanda Napoleon, MD  guaiFENesin-codeine [facility]) 100-10 MG/5ML syrup Take 10 mLs by mouth 3 (three) times daily as needed. 11/27/14   Khalfani Weideman L. Lavere Shinsky, PA-C  ibuprofen (ADVIL,MOTRIN) 600 MG tablet Take 1 tablet (600 mg total) by mouth every 6 (six) hours as needed. Take with food 11/27/14   Gerrod Maule L. Kamila Broda, PA-C  lisinopril (PRINIVIL,ZESTRIL) 5 MG tablet Take 5 mg by mouth every morning.    Historical Provider, MD  traMADol (ULTRAM) 50  MG tablet Take 1-2, if needed, every 6 hours for pain. 09/19/14   Volanda Napoleon, MD   BP 175/116 mmHg  Pulse 103  Temp(Src) 97.5 F (36.4 C) (Oral)  Resp 18  Ht 5\' 11"  (1.803 m)  Wt 225 lb (102.059 kg)  BMI 31.39 kg/m2  SpO2 97% Physical Exam  Constitutional: He is oriented to person, place, and time. He appears well-developed and well-nourished. No distress.  HENT:  Head: Normocephalic and atraumatic.  Right Ear: Tympanic membrane and ear canal normal.  Left Ear: Tympanic membrane and ear canal normal.  Nose: Mucosal edema and rhinorrhea present.  Mouth/Throat: Uvula is midline and mucous membranes are normal. No trismus in the jaw. No uvula swelling. Posterior oropharyngeal erythema present. No oropharyngeal exudate, posterior oropharyngeal edema or tonsillar abscesses.  Eyes: Conjunctivae are normal.  Neck: Normal range of motion and phonation normal. Neck supple. No Brudzinski's sign and no Kernig's sign noted.  Cardiovascular: Normal rate, regular rhythm, normal heart sounds and intact distal pulses.   No murmur heard. Pulmonary/Chest: Effort normal. No respiratory distress. He has no wheezes. He has no rales.  Coarse lung sounds bilaterally, clear after cough.  No rales  Abdominal: Soft. He exhibits no distension. There is no tenderness. There is no rebound and no guarding.  Musculoskeletal: He exhibits no edema.  Lymphadenopathy:    He has no cervical adenopathy.  Neurological: He is alert and oriented to person, place, and time. He exhibits normal muscle tone. Coordination normal.  Skin: Skin is warm and dry.  Nursing note and vitals reviewed.   ED Course  Procedures (including critical care time) Labs Review Labs Reviewed - No data to display  Imaging Review Dg Chest 2 View  11/27/2014   CLINICAL DATA:  Generalized body aches, cough, and congestion for 2 weeks, history hypertension, former smoker, history Hodgkin lymphoma  EXAM: CHEST  2 VIEW  COMPARISON:   12/30/2011, 12/28/2011  FINDINGS: Prior mediastinal surgery.  Interval removal of RIGHT jugular Port-A-Cath.  Borderline enlargement of cardiac silhouette.  Mediastinal contours and pulmonary vascularity normal.  Chronic peribronchial thickening with scarring at lingula.  No acute infiltrate, pleural effusion or pneumothorax.  Bones unremarkable.  IMPRESSION: No acute abnormalities.   Electronically Signed   By: Lavonia Dana M.D.   On: 11/27/2014 13:15     EKG Interpretation None      MDM   Final diagnoses:  Cough   Pt is hypertensive, takes lisinopril , but did not take it this morning.  Strongly encourage pt to take his medication when he returns home.  No tachycardia or hypoxia.  Pt agrees to plan and appears stable for d/c.  Advised to return if not improving.   Marialuiza Car L. Vanessa Hamilton, PA-C 11/28/14 1342  Virgel Manifold, MD 12/01/14 [phone]

## 2014-11-27 NOTE — Discharge Instructions (Signed)
Cough, Adult   A cough is a reflex. It helps you clear your throat and airways. A cough can help heal your body. A cough can last 2 or 3 weeks (acute) or may last more than 8 weeks (chronic). Some common causes of a cough can include an infection, allergy, or a cold.  HOME CARE  · Only take medicine as told by your doctor.  · If given, take your medicines (antibiotics) as told. Finish them even if you start to feel better.  · Use a cold steam vaporizer or humidifier in your home. This can help loosen thick spit (secretions).  · Sleep so you are almost sitting up (semi-upright). Use pillows to do this. This helps reduce coughing.  · Rest as needed.  · Stop smoking if you smoke.  GET HELP RIGHT AWAY IF:  · You have yellowish-white fluid (pus) in your thick spit.  · Your cough gets worse.  · Your medicine does not reduce coughing, and you are losing sleep.  · You cough up blood.  · You have trouble breathing.  · Your pain gets worse and medicine does not help.  · You have a fever.  MAKE SURE YOU:   · Understand these instructions.  · Will watch your condition.  · Will get help right away if you are not doing well or get worse.  Document Released: 06/30/2011 Document Revised: 03/03/2014 Document Reviewed: 06/30/2011  [employer]® Patient Information ©2015 [employer]. This information is not intended to replace advice given to you by your health care provider. Make sure you discuss any questions you have with your health care provider.

## 2014-11-27 NOTE — ED Notes (Signed)
Having cold symptoms for 2 weeks.  taken OTC meds without relief.

## 2014-12-19 ENCOUNTER — Encounter: Payer: Self-pay | Admitting: Family

## 2014-12-19 ENCOUNTER — Other Ambulatory Visit (HOSPITAL_BASED_OUTPATIENT_CLINIC_OR_DEPARTMENT_OTHER): Payer: Self-pay | Admitting: Lab

## 2014-12-19 ENCOUNTER — Ambulatory Visit (HOSPITAL_BASED_OUTPATIENT_CLINIC_OR_DEPARTMENT_OTHER): Payer: Self-pay | Admitting: Family

## 2014-12-19 VITALS — BP 167/97 | HR 81 | Temp 97.4°F | Resp 20 | Ht 71.0 in | Wt 245.0 lb

## 2014-12-19 DIAGNOSIS — C8110 Nodular sclerosis classical Hodgkin lymphoma, unspecified site: Secondary | ICD-10-CM

## 2014-12-19 DIAGNOSIS — C8192 Hodgkin lymphoma, unspecified, intrathoracic lymph nodes: Secondary | ICD-10-CM

## 2014-12-19 DIAGNOSIS — M5416 Radiculopathy, lumbar region: Secondary | ICD-10-CM

## 2014-12-19 LAB — CMP (CANCER CENTER ONLY)
ALBUMIN: 4.3 g/dL (ref 3.3–5.5)
ALT(SGPT): 21 U/L (ref 10–47)
AST: 31 U/L (ref 11–38)
Alkaline Phosphatase: 53 U/L (ref 26–84)
BUN: 13 mg/dL (ref 7–22)
CALCIUM: 9.2 mg/dL (ref 8.0–10.3)
CHLORIDE: 99 meq/L (ref 98–108)
CO2: 29 meq/L (ref 18–33)
Creat: 1.4 mg/dl — ABNORMAL HIGH (ref 0.6–1.2)
Glucose, Bld: 148 mg/dL — ABNORMAL HIGH (ref 73–118)
POTASSIUM: 4.3 meq/L (ref 3.3–4.7)
Sodium: 143 mEq/L (ref 128–145)
Total Bilirubin: 0.9 mg/dl (ref 0.20–1.60)
Total Protein: 8.1 g/dL (ref 6.4–8.1)

## 2014-12-19 LAB — CBC WITH DIFFERENTIAL (CANCER CENTER ONLY)
BASO#: 0.1 10*3/uL (ref 0.0–0.2)
BASO%: 1.1 % (ref 0.0–2.0)
EOS%: 2.8 % (ref 0.0–7.0)
Eosinophils Absolute: 0.2 10*3/uL (ref 0.0–0.5)
HCT: 43 % (ref 38.7–49.9)
HGB: 14.6 g/dL (ref 13.0–17.1)
LYMPH#: 1.9 10*3/uL (ref 0.9–3.3)
LYMPH%: 21.7 % (ref 14.0–48.0)
MCH: 29 pg (ref 28.0–33.4)
MCHC: 34 g/dL (ref 32.0–35.9)
MCV: 85 fL (ref 82–98)
MONO#: 0.5 10*3/uL (ref 0.1–0.9)
MONO%: 6.1 % (ref 0.0–13.0)
NEUT#: 5.8 10*3/uL (ref 1.5–6.5)
NEUT%: 68.3 % (ref 40.0–80.0)
Platelets: 320 10*3/uL (ref 145–400)
RBC: 5.04 10*6/uL (ref 4.20–5.70)
RDW: 13.5 % (ref 11.1–15.7)
WBC: 8.5 10*3/uL (ref 4.0–10.0)

## 2014-12-19 NOTE — Progress Notes (Signed)
Kevin Reese  [phone] [phone]) [phone]  ID: Kevin Reese OB: 07-Aug-1981 MR#: [id number] Patient Care Team: No Pcp Per Patient as PCP - General (General Practice) Volanda Napoleon, MD as Attending Physician (Internal Medicine) Melrose Nakayama, MD (Cardiothoracic Surgery)  DIAGNOSIS: Stage IIA nodular sclerosing Hodgkin's disease  INTERVAL HISTORY: Kevin Reese is here today for a follow-up. He has not had any new issues since we last saw him.  He is still having back pain. He was unable to have the MRI because he will not have insurance until March 1st.  He has been battling a respiratory infection and is on antibiotics. He denies fever, chills, n/v, cough, rash, dizziness, headache, SOB, chest pain, palpitations, abdominal pain, constipation, diarrhea, blood in urine or stool. No lymphadenopathy.  No swelling, tenderness, numbness or tingling in her extremities. No new aches or pains.  His appetite is good and he has gained 20 lbs since we saw him last. He is staying hydrated.   CURRENT TREATMENT: Observation  REVIEW OF SYSTEMS: All other 10 point review of systems is negative.   PAST MEDICAL HISTORY: Past Medical History  Diagnosis Date  . Mediastinal mass   . Hypertension     was on Azor and Amlodipine/Benazepril;was started on these in 2009;came off of these meds a yr ago bc can't afford them  . Heart murmur     diagnosed in high school  . Chronic back pain     hx buldging disc  . Anxiety     takes Klonopin daily  . 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 Cancer   . Pneumonia   . Diabetes mellitus     Pt has tested WNL since wt loss.    PAST SURGICAL HISTORY: Past Surgical History  Procedure Laterality Date  . Back surgery  2009/2011  . Mediastinal mass excision  10/2011    FAMILY HISTORY Family History  Problem Relation Age of Onset  . Anesthesia problems Neg Hx   . Hypotension Neg Hx   .  Malignant hyperthermia Neg Hx   . Pseudochol deficiency Neg Hx     GYNECOLOGIC HISTORY:  No LMP for male patient.   SOCIAL HISTORY: History   Social History  . Marital Status: Single    Spouse Name: N/A  . Number of Children: N/A  . Years of Education: N/A   Occupational History  . Not on file.   Social History Main Topics  . Smoking status: Former Smoker -- 0.50 packs/day for 14 years    Types: Cigarettes    Start date: 10/27/1999    Quit date: 09/26/2013  . Smokeless tobacco: Never Used     Comment: quit 6 months ago  . Alcohol Use: 3.6 oz/week    6 Cans of beer per week  . Drug Use: 3.00 per week    Special: Marijuana  . Sexual Activity: Yes   Other Topics Concern  . Not on file   Social History Narrative    ADVANCED DIRECTIVES:  <no information>  HEALTH MAINTENANCE: History  Substance Use Topics  . Smoking status: Former Smoker -- 0.50 packs/day for 14 years    Types: Cigarettes    Start date: 10/27/1999    Quit date: 09/26/2013  . Smokeless tobacco: Never Used     Comment: quit 6 months ago  . Alcohol Use: 3.6 oz/week    6 Cans of beer per week   Colonoscopy: PAP: Bone density: Lipid panel:  Allergies  Allergen Reactions  . Penicillins Rash and Other (See Comments)    Allergic since childhood    Current Outpatient Prescriptions  Medication Sig Dispense Refill  . clonazePAM (KLONOPIN) 2 MG tablet Take 2 mg by mouth 4 (four) times daily as needed. For anxiety    . cyclobenzaprine (FLEXERIL) 10 MG tablet Take 1 tablet (10 mg total) by mouth 3 (three) times daily as needed for muscle spasms. 60 tablet 2  . diazepam (VALIUM) 5 MG tablet Take 1 tablet (5 mg total) by mouth once. Prior to MRI for claustrophobia. May repeat times one if ineffective. 2 tablet 0  . guaiFENesin-codeine (ROBITUSSIN AC) 100-10 MG/5ML syrup Take 10 mLs by mouth 3 (three) times daily as needed. 120 mL 0  . ibuprofen (ADVIL,MOTRIN) 600 MG tablet Take 1 tablet (600 mg total)  by mouth every 6 (six) hours as needed. Take with food 20 tablet 0  . lisinopril (PRINIVIL,ZESTRIL) 5 MG tablet Take 5 mg by mouth every morning.    . traMADol (ULTRAM) 50 MG tablet Take 1-2, if needed, every 6 hours for pain. 90 tablet 2   No current facility-administered medications for this visit.    OBJECTIVE: Filed Vitals:   12/19/14 1043  BP: 167/97  Pulse: 81  Temp: 97.4 F (36.3 C)  Resp: 20    Filed Weights   12/19/14 1043  Weight: 245 lb (111.131 kg)   ECOG FS:0 - Asymptomatic Ocular: Sclerae unicteric, pupils equal, round and reactive to light Ear-nose-throat: Oropharynx clear, dentition fair Lymphatic: No cervical or supraclavicular adenopathy Lungs no rales or rhonchi, good excursion bilaterally Heart regular rate and rhythm, no murmur appreciated Abd soft, nontender, positive bowel sounds MSK no focal spinal tenderness, no joint edema Neuro: non-focal, well-oriented, appropriate affect  LAB RESULTS: CMP     Component Value Date/Time   NA 139 09/19/2014 0924   NA 138 09/24/2013 1018   K 4.1 09/19/2014 0924   K 3.9 09/24/2013 1018   CL 101 09/19/2014 0924   CL 105 09/24/2013 1018   CO2 26 09/19/2014 0924   CO2 25 09/24/2013 1018   GLUCOSE 134* 09/19/2014 0924   GLUCOSE 158* 09/24/2013 1018   BUN 13 09/19/2014 0924   BUN 14 09/24/2013 1018   CREATININE 1.3* 09/19/2014 0924   CREATININE 1.31 09/24/2013 1018   CALCIUM 8.9 09/19/2014 0924   CALCIUM 9.1 09/24/2013 1018   PROT 7.4 09/19/2014 0924   PROT 6.7 09/24/2013 1018   ALBUMIN 4.4 09/24/2013 1018   AST 30 09/19/2014 0924   AST 12 09/24/2013 1018   ALT 23 09/19/2014 0924   ALT <8 09/24/2013 1018   ALKPHOS 43 09/19/2014 0924   ALKPHOS 39 09/24/2013 1018   BILITOT 0.80 09/19/2014 0924   BILITOT 0.4 09/24/2013 1018   GFRNONAA 79* 12/30/2011 1555   GFRAA >90 12/30/2011 1555   INo results found for: SPEP, UPEP Lab Results  Component Value Date   WBC 8.5 12/19/2014   NEUTROABS 5.8 12/19/2014    HGB 14.6 12/19/2014   HCT 43.0 12/19/2014   MCV 85 12/19/2014   PLT 320 12/19/2014   No results found for: LABCA2 No components found for: [id number] No results for input(s): INR in the last 168 hours.  STUDIES: None  ASSESSMENT/PLAN: Kevin Reese is 34 year old gentleman with a history of stage IIa nodular sclerosing Hodgkin's disease. This was found accidentally with a chest x-ray after he fell. He was treated with 4 cycles of chemotherapy with ABVD and tolerated well.  He has been in remission since April 2013. He is still doing well and is asymptomatic at this time.  He is planning on having the MRI of the spine once he has insurance. His back pain is unchanged.  His CBC today was normal.  We will see him back in 6 months for labs and follow-up.  He knows to call here with any questions or concerns and to go to the ED in the event of an emergency. We can certainly see him sooner if need be.   Eliezer Bottom, NP 12/19/2014 10:46 AM

## 2015-06-19 ENCOUNTER — Other Ambulatory Visit: Payer: Self-pay

## 2015-06-19 ENCOUNTER — Ambulatory Visit: Payer: Self-pay | Admitting: Hematology & Oncology

## 2015-06-22 ENCOUNTER — Telehealth: Payer: Self-pay | Admitting: Hematology & Oncology

## 2015-06-22 NOTE — Telephone Encounter (Signed)
Spoke with pt regarding scheduling hospital f/u for 8/30. Pt confirmed appt

## 2015-06-23 ENCOUNTER — Ambulatory Visit: Payer: Self-pay | Admitting: Hematology & Oncology

## 2015-06-23 ENCOUNTER — Other Ambulatory Visit: Payer: Self-pay

## 2015-07-01 ENCOUNTER — Encounter: Payer: Self-pay | Admitting: Family

## 2015-07-01 ENCOUNTER — Telehealth: Payer: Self-pay | Admitting: Hematology & Oncology

## 2015-07-01 ENCOUNTER — Ambulatory Visit (HOSPITAL_BASED_OUTPATIENT_CLINIC_OR_DEPARTMENT_OTHER): Payer: Self-pay | Admitting: Family

## 2015-07-01 ENCOUNTER — Other Ambulatory Visit (HOSPITAL_BASED_OUTPATIENT_CLINIC_OR_DEPARTMENT_OTHER): Payer: Self-pay

## 2015-07-01 VITALS — BP 108/59 | HR 70 | Resp 16

## 2015-07-01 DIAGNOSIS — G8929 Other chronic pain: Secondary | ICD-10-CM

## 2015-07-01 DIAGNOSIS — C8110 Nodular sclerosis classical Hodgkin lymphoma, unspecified site: Secondary | ICD-10-CM

## 2015-07-01 DIAGNOSIS — C8192 Hodgkin lymphoma, unspecified, intrathoracic lymph nodes: Secondary | ICD-10-CM

## 2015-07-01 DIAGNOSIS — M545 Low back pain: Secondary | ICD-10-CM

## 2015-07-01 DIAGNOSIS — I10 Essential (primary) hypertension: Secondary | ICD-10-CM

## 2015-07-01 LAB — COMPREHENSIVE METABOLIC PANEL
ALT: 9 U/L (ref 9–46)
AST: 17 U/L (ref 10–40)
Albumin: 4.7 g/dL (ref 3.6–5.1)
Alkaline Phosphatase: 34 U/L — ABNORMAL LOW (ref 40–115)
BUN: 32 mg/dL — ABNORMAL HIGH (ref 7–25)
CALCIUM: 10.1 mg/dL (ref 8.6–10.3)
CO2: 26 mmol/L (ref 20–31)
Chloride: 99 mmol/L (ref 98–110)
Creatinine, Ser: 1.72 mg/dL — ABNORMAL HIGH (ref 0.60–1.35)
GLUCOSE: 110 mg/dL — AB (ref 65–99)
Potassium: 4.9 mmol/L (ref 3.5–5.3)
Sodium: 135 mmol/L (ref 135–146)
Total Bilirubin: 0.6 mg/dL (ref 0.2–1.2)
Total Protein: 7.1 g/dL (ref 6.1–8.1)

## 2015-07-01 LAB — CBC WITH DIFFERENTIAL (CANCER CENTER ONLY)
BASO#: 0.1 10*3/uL (ref 0.0–0.2)
BASO%: 1.1 % (ref 0.0–2.0)
EOS%: 2.7 % (ref 0.0–7.0)
Eosinophils Absolute: 0.3 10*3/uL (ref 0.0–0.5)
HCT: 45.7 % (ref 38.7–49.9)
HGB: 15.2 g/dL (ref 13.0–17.1)
LYMPH#: 2.7 10*3/uL (ref 0.9–3.3)
LYMPH%: 27.9 % (ref 14.0–48.0)
MCH: 29 pg (ref 28.0–33.4)
MCHC: 33.3 g/dL (ref 32.0–35.9)
MCV: 87 fL (ref 82–98)
MONO#: 0.8 10*3/uL (ref 0.1–0.9)
MONO%: 7.7 % (ref 0.0–13.0)
NEUT#: 5.9 10*3/uL (ref 1.5–6.5)
NEUT%: 60.6 % (ref 40.0–80.0)
Platelets: 349 10*3/uL (ref 145–400)
RBC: 5.25 10*6/uL (ref 4.20–5.70)
RDW: 13.3 % (ref 11.1–15.7)
WBC: 9.8 10*3/uL (ref 4.0–10.0)

## 2015-07-01 LAB — LACTATE DEHYDROGENASE: LDH: 141 U/L (ref 94–250)

## 2015-07-01 NOTE — Telephone Encounter (Signed)
Pt in ofc today and has NO INSURANCE. He also advised, he has applied for Medicaid and Medicare. I gave him an [place] ([employer]) appl to complete and return.

## 2015-07-01 NOTE — Progress Notes (Signed)
Hematology and Oncology Follow Up Visit  HAYZE GAZDA [id number] [birth year] 34 y.o. 07/01/2015   Principle Diagnosis:  Stage IIA nodular sclerosing Hodgkin's disease  Current Therapy:   Observation    Interim History: Mr. Oyama is here today for a follow-up. He was recently hospitalized at [facility] in [place], [place] with heart failure. We will work on getting these records. His is has some fatigue but is starting to feel better.  He states that his EF was 20% at that time and that he had pulmonary edema and cardiomegaly. They diuresed 8 liters of fluid off of him during his stay.  He has had some dizziness since starting starting bumex, amlodipine, hydralazine and lisinopril. His BP today is 108/59 with a HR of 70.  He denies fever, chills, n/v, cough, rash, headache, SOB, chest pain, palpitations, constipation, diarrhea, blood in urine or stool.  No lymphadenopathy found on his exam.  No swelling or tenderness in her extremities. No new aches or pains. He has chronic back pain due to a bulging disc which is unchanged since his last visit.  He has mild neuropathy in his feet that is unchanged This has not effected his gait and he has had no falls.  His appetite is improving. He is on fluid restrictions right now but is staying hydrated. His weight is stable.  He filed for disability today and also for Medicaid. He has no insurance at this time. This is causing him a good deal of stress. He did not have his MRI earlier this year due to not having insurance.   Medications:    Medication List       This list is accurate as of: 07/01/15  1:22 PM.  Always use your most recent med list.               clonazePAM 2 MG tablet  Commonly known as:  KLONOPIN  Take 2 mg by mouth 4 (four) times daily as needed. For anxiety     cyclobenzaprine 10 MG tablet  Commonly known as:  FLEXERIL  Take 1 tablet (10 mg total) by mouth 3 (three) times daily as needed for muscle spasms.      diazepam 5 MG tablet  Commonly known as:  VALIUM  Take 1 tablet (5 mg total) by mouth once. Prior to MRI for claustrophobia. May repeat times one if ineffective.     guaiFENesin-codeine 100-10 MG/5ML syrup  Commonly known as:  ROBITUSSIN AC  Take 10 mLs by mouth 3 (three) times daily as needed.     ibuprofen 600 MG tablet  Commonly known as:  ADVIL,MOTRIN  Take 1 tablet (600 mg total) by mouth every 6 (six) hours as needed. Take with food     lisinopril 5 MG tablet  Commonly known as:  PRINIVIL,ZESTRIL  Take 5 mg by mouth every morning.     traMADol 50 MG tablet  Commonly known as:  ULTRAM  Take 1-2, if needed, every 6 hours for pain.        Allergies:  Allergies  Allergen Reactions  . Penicillins Rash and Other (See Comments)    Allergic since childhood    Past Medical History, Surgical history, Social history, and Family History were reviewed and updated.  Review of Systems: All other 10 point review of systems is negative.   Physical Exam:  vitals were not taken for this visit.  Wt Readings from Last 3 Encounters:  12/19/14 245 lb (111.131 kg)  11/27/14 225 lb (  102.059 kg)  09/19/14 253 lb (114.76 kg)    Ocular: Sclerae unicteric, pupils equal, round and reactive to light Ear-nose-throat: Oropharynx clear, dentition fair Lymphatic: No cervical or supraclavicular adenopathy Lungs no rales or rhonchi, good excursion bilaterally Heart regular rate and rhythm, no murmur appreciated Abd soft, nontender, positive bowel sounds MSK no focal spinal tenderness, no joint edema Neuro: non-focal, well-oriented, appropriate affect Breasts: Deferred  Lab Results  Component Value Date   WBC 8.5 12/19/2014   HGB 14.6 12/19/2014   HCT 43.0 12/19/2014   MCV 85 12/19/2014   PLT 320 12/19/2014   No results found for: FERRITIN, IRON, TIBC, UIBC, IRONPCTSAT Lab Results  Component Value Date   RBC 5.04 12/19/2014   No results found for: KPAFRELGTCHN, LAMBDASER,  KAPLAMBRATIO No results found for: IGGSERUM, IGA, IGMSERUM No results found for: Odetta Pink, SPEI   Chemistry      Component Value Date/Time   NA 143 12/19/2014 1026   NA 138 09/24/2013 1018   K 4.3 12/19/2014 1026   K 3.9 09/24/2013 1018   CL 99 12/19/2014 1026   CL 105 09/24/2013 1018   CO2 29 12/19/2014 1026   CO2 25 09/24/2013 1018   BUN 13 12/19/2014 1026   BUN 14 09/24/2013 1018   CREATININE 1.4* 12/19/2014 1026   CREATININE 1.31 09/24/2013 1018      Component Value Date/Time   CALCIUM 9.2 12/19/2014 1026   CALCIUM 9.1 09/24/2013 1018   ALKPHOS 53 12/19/2014 1026   ALKPHOS 39 09/24/2013 1018   AST 31 12/19/2014 1026   AST 12 09/24/2013 1018   ALT 21 12/19/2014 1026   ALT <8 09/24/2013 1018   BILITOT 0.90 12/19/2014 1026   BILITOT 0.4 09/24/2013 1018     Impression and Plan: Mr. Dada is 34 yo gentleman with a history of stage IIa nodular sclerosing Hodgkin's disease. This was found accidentally with a chest x-ray after an accident. He was treated with 4 cycles of chemotherapy with ABVD and tolerated well. He has been in remission since April 2013. There has been no evidence of recurrence.  His CBC today looks good. White count is within normal range. No anemia and platelet count is good.  We did refer him to cardiology Dr. Haroldine Laws and also to [facility] for primary care.  We will continue to follow along with him and plan to see him back in 8 months for labs and follow-up.  He knows to contact us with any questions or concerns. We can certainly see him sooner if need be.   Eliezer Bottom, NP 8/31/20161:22 PM

## 2015-07-09 ENCOUNTER — Telehealth: Payer: Self-pay | Admitting: *Deleted

## 2015-07-09 ENCOUNTER — Telehealth: Payer: Self-pay | Admitting: General Practice

## 2015-07-09 NOTE — Telephone Encounter (Signed)
Patient had sent a referral request to establish primary care. Patient is aware that we aren't  accepting new patients until October.

## 2015-07-09 NOTE — Telephone Encounter (Signed)
Spoke with CHF clinic which is where Dr. Haroldine Laws office is and first available appt scheduled for Sept 26th at 2 pm.

## 2015-07-09 NOTE — Telephone Encounter (Signed)
I process the referral to specialist fom a pcp here . I don't do referrals to pcp  Thank you

## 2015-07-27 ENCOUNTER — Encounter (HOSPITAL_COMMUNITY): Payer: Self-pay | Admitting: *Deleted

## 2015-07-27 ENCOUNTER — Encounter: Payer: Self-pay | Admitting: Licensed Clinical Social Worker

## 2015-07-27 ENCOUNTER — Ambulatory Visit (HOSPITAL_COMMUNITY)
Admission: RE | Admit: 2015-07-27 | Discharge: 2015-07-27 | Disposition: A | Discharge status: Home / Self Care | Payer: Self-pay | Source: Ambulatory Visit | Attending: Internal Medicine | Admitting: Internal Medicine

## 2015-07-27 VITALS — BP 120/64 | HR 68 | Wt 235.8 lb

## 2015-07-27 DIAGNOSIS — Z9221 Personal history of antineoplastic chemotherapy: Secondary | ICD-10-CM | POA: Insufficient documentation

## 2015-07-27 DIAGNOSIS — I255 Ischemic cardiomyopathy: Secondary | ICD-10-CM

## 2015-07-27 DIAGNOSIS — N189 Chronic kidney disease, unspecified: Secondary | ICD-10-CM | POA: Insufficient documentation

## 2015-07-27 DIAGNOSIS — Z8249 Family history of ischemic heart disease and other diseases of the circulatory system: Secondary | ICD-10-CM | POA: Insufficient documentation

## 2015-07-27 DIAGNOSIS — I10 Essential (primary) hypertension: Secondary | ICD-10-CM

## 2015-07-27 DIAGNOSIS — E1122 Type 2 diabetes mellitus with diabetic chronic kidney disease: Secondary | ICD-10-CM | POA: Insufficient documentation

## 2015-07-27 DIAGNOSIS — G629 Polyneuropathy, unspecified: Secondary | ICD-10-CM | POA: Insufficient documentation

## 2015-07-27 DIAGNOSIS — C8190 Hodgkin lymphoma, unspecified, unspecified site: Secondary | ICD-10-CM | POA: Insufficient documentation

## 2015-07-27 DIAGNOSIS — Z87891 Personal history of nicotine dependence: Secondary | ICD-10-CM | POA: Insufficient documentation

## 2015-07-27 DIAGNOSIS — Z79899 Other long term (current) drug therapy: Secondary | ICD-10-CM | POA: Insufficient documentation

## 2015-07-27 DIAGNOSIS — I429 Cardiomyopathy, unspecified: Secondary | ICD-10-CM | POA: Insufficient documentation

## 2015-07-27 DIAGNOSIS — I129 Hypertensive chronic kidney disease with stage 1 through stage 4 chronic kidney disease, or unspecified chronic kidney disease: Secondary | ICD-10-CM | POA: Insufficient documentation

## 2015-07-27 DIAGNOSIS — G4733 Obstructive sleep apnea (adult) (pediatric): Secondary | ICD-10-CM | POA: Insufficient documentation

## 2015-07-27 DIAGNOSIS — I5022 Chronic systolic (congestive) heart failure: Secondary | ICD-10-CM | POA: Insufficient documentation

## 2015-07-27 LAB — LIPID PANEL
Cholesterol: 170 mg/dL (ref 0–200)
HDL: 30 mg/dL — AB (ref >40)
LDL Cholesterol: UNDETERMINED mg/dL (ref 0–99)
Total CHOL/HDL Ratio: 5.7 RATIO
Triglycerides: 493 mg/dL — ABNORMAL HIGH (ref <150)
VLDL: UNDETERMINED mg/dL (ref 0–40)

## 2015-07-27 LAB — BRAIN NATRIURETIC PEPTIDE: B Natriuretic Peptide: 60.7 pg/mL (ref 0.0–100.0)

## 2015-07-27 LAB — BASIC METABOLIC PANEL
ANION GAP: 11 (ref 5–15)
BUN: 16 mg/dL (ref 6–20)
CO2: 23 mmol/L (ref 22–32)
Calcium: 10.3 mg/dL (ref 8.9–10.3)
Chloride: 103 mmol/L (ref 101–111)
Creatinine, Ser: 1.31 mg/dL — ABNORMAL HIGH (ref 0.61–1.24)
GFR calc Af Amer: >60 mL/min (ref >60)
Glucose, Bld: 116 mg/dL — ABNORMAL HIGH (ref 65–99)
POTASSIUM: 4.3 mmol/L (ref 3.5–5.1)
SODIUM: 137 mmol/L (ref 135–145)

## 2015-07-27 LAB — CBC
HCT: 45.1 % (ref 39.0–52.0)
Hemoglobin: 15.3 g/dL (ref 13.0–17.0)
MCH: 28.7 pg (ref 26.0–34.0)
MCHC: 33.9 g/dL (ref 30.0–36.0)
MCV: 84.5 fL (ref 78.0–100.0)
PLATELETS: 266 10*3/uL (ref 150–400)
RBC: 5.34 MIL/uL (ref 4.22–5.81)
RDW: 13.3 % (ref 11.5–15.5)
WBC: 9.3 10*3/uL (ref 4.0–10.5)

## 2015-07-27 LAB — PROTIME-INR
INR: 0.95 (ref 0.00–1.49)
Prothrombin Time: 12.9 seconds (ref 11.6–15.2)

## 2015-07-27 LAB — TSH: TSH: 1.393 u[IU]/mL (ref 0.350–4.500)

## 2015-07-27 MED ORDER — ISOSORBIDE MONONITRATE ER 60 MG PO TB24: 60.0000 mg | ORAL_TABLET | Freq: Every day | ORAL | Status: DC

## 2015-07-27 MED ORDER — FUROSEMIDE 20 MG PO TABS: 40.0000 mg | ORAL_TABLET | Freq: Two times a day (BID) | ORAL | Status: DC

## 2015-07-27 NOTE — Progress Notes (Signed)
Advanced Heart Failure Medication Review by a Pharmacist  Does the patient  feel that his/her medications are working for him/her?  yes  Has the patient been experiencing any side effects to the medications prescribed?  no  Does the patient measure his/her own blood pressure or blood glucose at home?  yes   Does the patient have any problems obtaining medications due to transportation or finances?   yes  Understanding of regimen: good Understanding of indications: good Potential of compliance: good    Pharmacist comments: Kevin Reese is a pleasant 34 yo M presenting with his wife and a current medication list. He was recently discharged from a hospital in [place] and we are waiting for those records. He states that Dr. Marin Olp discontinued his potassium supplement about a month ago. He also states that he does not have Rx insurance but is in the process of applying for Medicaid. In the meantime, we will provide HF medications to him through our HF fund. He did not have any other medication-related questions or concerns at this time.   Kevin Reese. Kevin Reese, PharmD, BCPS, CPP Clinical Pharmacist Pager: [phone] Phone: [phone] 07/27/2015 2:32 PM

## 2015-07-27 NOTE — Patient Instructions (Addendum)
Stop Bumex Stop Isordil  Start Lasix 40 mg 2 tablets twice a day  Start Isosorbide Mono 60 mg one tablet daily  Sleep study  Heart Cath set up for October 5 @ 0830 arrive at [facility] at [place] today  Follow up in 2 weeks

## 2015-07-27 NOTE — Progress Notes (Signed)
Patient ID: Kevin Reese, male   DOB: [birth year], 34 y.o.   MRN: [id number] Oncologist: Dr. Marin Olp Cardiologist: Dr. Aundra Dubin  34 yo with history of HTN, diabetes, Hodgkins disease, CKD, and chronic systolic CHF presents for CHF clinic evaluation.  Patient has had HTN since at least 2013.  It has only been sporadically treated.  He has a diagnosis of diabetes but is not on meds. He was treated for Hodgkins disease in 2013 with ABVD which contains doxorubicin.  He was treated in [place], and does not appear to have had a prior echo done here. He was a heavy drinker in the past, he has cut this back to about a 6-pack a week.    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  Finally, he was admitted in 8/16 to a hospital in [place] (had been up there for work).  EF was found to be 20% and he had pulmonary edema.  BP was > 200/100 at admission (he had not been on BP meds).  He was diuresed and BP was controlled.  He was sent home on a regimen of cardiac and BP meds.    Today, BP is controlled. He is taking his meds.  He is short of breath walking about 100 yards or walking up stairs.  No orthopnea/PND.  No chest pain.  No lightheadedness or syncope. No palpitations.  He snores loudly and has daytime sleepiness.   ECG: NSR, normal  Labs (8/16): K 4.9, creatinine 1.72  PMH: 1. HTN: Since at least 2013. 2. Prior ETOH abuse 3. Type II diabetes: Not on meds currently.  4. Low back pain: h/o disc herniations, s/p back surgery x 2.  5. Stage IIa nodular sclerosing Hodgkin's disease: 4 cycles ABVD (doxorubicin, bleomycin, vinblastine, dacarbazine) in 4/13.  Now in remission.  6. CKD 7. 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  8. Chronic systolic CHF: 0/86 admission to hospital in [place] with acute systolic CHF. Echo showed EF 20%.  He was diuresed and discharged home.    SH:  Nonsmoker (quit in 2013), lives with girlfriend in [place], out of work Nature conservation officer.  Prior heavy ETOH, has cut back a lot (now drinks a 6 pack in a week).   FH: Grandfather with MI in his 56s and died at 42 from [place].  1st cousin with "99% blockage" in her 31s.  No siblings.   ROS: All systems reviewed and negative except as per HPI.   Current Outpatient Prescriptions  Medication Sig Dispense Refill  . amLODipine (NORVASC) 10 MG tablet Take 10 mg by mouth daily.    . carvedilol (COREG) 25 MG tablet Take 25 mg by mouth 2 (two) times daily with a meal.    . clonazePAM (KLONOPIN) 1 MG tablet Take 1 mg by mouth 2 (two) times daily as needed for anxiety.    . folic acid (FOLVITE) 1 MG tablet Take 1 mg by mouth daily.    . hydrALAZINE (APRESOLINE) 50 MG tablet Take 50 mg by mouth 3 (three) times daily.    . Hydrocodone-Acetaminophen (VICODIN) 5-300 MG TABS Take 1-2 tablets by mouth every 6 (six) hours as needed (pain).    Marland Kitchen ibuprofen (ADVIL,MOTRIN) 600 MG tablet Take 600 mg by mouth every 6 (six) hours as needed for mild pain.    Marland Kitchen lisinopril (PRINIVIL,ZESTRIL) 5 MG tablet Take 2.5 mg by mouth every morning.     Marland Kitchen  thiamine 100 MG tablet Take 100 mg by mouth daily.    . furosemide (LASIX) 20 MG tablet Take 2 tablets (40 mg total) by mouth 2 (two) times daily. 60 tablet 3  . isosorbide mononitrate (IMDUR) 60 MG 24 hr tablet Take 1 tablet (60 mg total) by mouth daily. 90 tablet 3   No current facility-administered medications for this encounter.   BP 120/64 mmHg  Pulse 68  Wt 235 lb 12.8 oz (106.958 kg)  SpO2 99% General: NAD Neck: No JVD, no thyromegaly or thyroid nodule.  Lungs: Clear to auscultation bilaterally with normal respiratory effort. CV: Nondisplaced PMI.  Heart regular S1/S2, no S3/S4, no murmur.  No peripheral edema.  No carotid bruit.  Normal pedal pulses.  Abdomen: Soft, nontender, no hepatosplenomegaly, no distention.  Skin: Intact without lesions or rashes.   Neurologic: Alert and oriented x 3.  Psych: Normal affect. Extremities: No clubbing or cyanosis.  HEENT: Normal.   Assessment/Plan: 1. Chronic systolic CHF: EF 15% by echo in [place].  NYHA class II-III symptoms currently.  He is not volume overloaded on exam.  Etiology of cardiomyopathy is uncertain.  He has multiple reasons for fall in EF.  He had chemotherapy including doxorubicin back in 2013.  He has had uncontrolled HTN apparently for at least several years.  He drank heavily up to 8/16 admission.  Finally, he has a family history of premature CAD.  He has not had chest pain.  - I encouraged him to cut back as much as possible on ETOH => he has already cut down to about a 6 pack a week.  - BP is much better controlled on current regimen.   - I will plan for a cardiac MRI to assess for infiltrative disease or prior myocarditis or MI, also to reassess LV and RV function.  - I will plan RHC/LHC to rule out CAD as a cause for his cardiomyopathy and to assess filling pressures and cardiac output.  He is only 66 but grandfather had multiple MIs in his 52s and 1st cousin just had "99% blockage" found and she is in her 45s.  He has diabetes and HTN.  We discussed risks/benefits of the procedure and he agrees to proceed.  - Continue current Coreg.  - Continue hydralazine 50 mg tid but will transition from Isordil to Imdur 60 mg daily, as Imdur is covered by our heart failure fund and we can get this medication for him.  - Continue current lisinopril 2.5 mg daily for now until we repeat BMET, will try to titrate up if creatinine remains stable.  - Stop Bumex and start Lasix 40 mg bid (Lasix covered by heart failure fund and not Bumex).   - Check BMET/BNP and TSH.  2. Suspected OSA: Will arrange for sleep study.  3. HTN: BP now controlled.  Continue current regimen.  4. Type II diabetes: This has been diagnosed but he is on no meds.  Check HgbA1c and refer to [facility].   5. Patient has no insurance.  I will have our social worker talk to him today.  He needs help with Medicaid. Will also see if we can get him an orange card to get the above procedures covered.   [name] 07/27/2015

## 2015-07-27 NOTE — Progress Notes (Signed)
CSW met with patient in the clinic to assist with insurance options. Patient has a pending medicaid and SSD application. Patient is in need of multiple medical tests and needs coverage. CSW discussed various options and due to pending medicaid status patient will need to wait for a denial letter from [facility] in order to apply for the [place] program.  Patient states he applied for medicaid a few weeks back and is hopeful to hear soon about approval. CSW discussed discount program and process should he be denied. CSW also provided number for patient to contact for further needs. Raquel Sarna, [place]

## 2015-07-28 ENCOUNTER — Other Ambulatory Visit: Payer: Self-pay | Admitting: *Deleted

## 2015-07-28 ENCOUNTER — Other Ambulatory Visit (HOSPITAL_COMMUNITY): Payer: Self-pay | Admitting: *Deleted

## 2015-07-28 MED ORDER — FENOFIBRATE 145 MG PO TABS: 145.0000 mg | ORAL_TABLET | Freq: Every day | ORAL | Status: DC

## 2015-07-29 ENCOUNTER — Other Ambulatory Visit (HOSPITAL_COMMUNITY): Payer: Self-pay | Admitting: Cardiology

## 2015-07-29 DIAGNOSIS — I5022 Chronic systolic (congestive) heart failure: Secondary | ICD-10-CM

## 2015-07-30 ENCOUNTER — Encounter: Payer: Self-pay | Admitting: Cardiology

## 2015-07-31 LAB — HEMOGLOBIN A1C
Hgb A1c MFr Bld: 7.1 % — ABNORMAL HIGH (ref 4.8–5.6)
Mean Plasma Glucose: 157 mg/dL

## 2015-08-03 ENCOUNTER — Encounter: Payer: Self-pay | Admitting: Cardiology

## 2015-08-05 ENCOUNTER — Encounter (HOSPITAL_COMMUNITY): Payer: Self-pay | Admitting: Cardiology

## 2015-08-05 ENCOUNTER — Encounter (HOSPITAL_COMMUNITY)
Admission: RE | Disposition: A | Discharge status: Home / Self Care | Payer: Self-pay | Source: Ambulatory Visit | Attending: Cardiology

## 2015-08-05 ENCOUNTER — Ambulatory Visit (HOSPITAL_COMMUNITY)
Admission: RE | Admit: 2015-08-05 | Discharge: 2015-08-05 | Disposition: A | Discharge status: Home / Self Care | Payer: Self-pay | Source: Ambulatory Visit | Attending: Cardiology | Admitting: Cardiology

## 2015-08-05 DIAGNOSIS — I429 Cardiomyopathy, unspecified: Secondary | ICD-10-CM | POA: Insufficient documentation

## 2015-08-05 DIAGNOSIS — Z8249 Family history of ischemic heart disease and other diseases of the circulatory system: Secondary | ICD-10-CM | POA: Insufficient documentation

## 2015-08-05 DIAGNOSIS — E1122 Type 2 diabetes mellitus with diabetic chronic kidney disease: Secondary | ICD-10-CM | POA: Insufficient documentation

## 2015-08-05 DIAGNOSIS — Z8571 Personal history of Hodgkin lymphoma: Secondary | ICD-10-CM | POA: Insufficient documentation

## 2015-08-05 DIAGNOSIS — N189 Chronic kidney disease, unspecified: Secondary | ICD-10-CM | POA: Insufficient documentation

## 2015-08-05 DIAGNOSIS — I5022 Chronic systolic (congestive) heart failure: Secondary | ICD-10-CM | POA: Insufficient documentation

## 2015-08-05 DIAGNOSIS — E1142 Type 2 diabetes mellitus with diabetic polyneuropathy: Secondary | ICD-10-CM | POA: Insufficient documentation

## 2015-08-05 DIAGNOSIS — I130 Hypertensive heart and chronic kidney disease with heart failure and stage 1 through stage 4 chronic kidney disease, or unspecified chronic kidney disease: Secondary | ICD-10-CM | POA: Insufficient documentation

## 2015-08-05 HISTORY — PX: CARDIAC CATHETERIZATION: SHX172

## 2015-08-05 LAB — POCT I-STAT 3, VENOUS BLOOD GAS (G3P V)
ACID-BASE EXCESS: 1 mmol/L (ref 0.0–2.0)
Acid-Base Excess: 1 mmol/L (ref 0.0–2.0)
Bicarbonate: 26.9 mEq/L — ABNORMAL HIGH (ref 20.0–24.0)
Bicarbonate: 26.5 mEq/L — ABNORMAL HIGH (ref 20.0–24.0)
O2 Saturation: 69 %
O2 Saturation: 70 %
PH VEN: 7.37 — AB (ref 7.250–7.300)
TCO2: 28 mmol/L (ref 0–100)
TCO2: 28 mmol/L (ref 0–100)
pCO2, Ven: 44.4 mmHg — ABNORMAL LOW (ref 45.0–50.0)
pCO2, Ven: 46 mmHg (ref 45.0–50.0)
pH, Ven: 7.39 — ABNORMAL HIGH (ref 7.250–7.300)
pO2, Ven: 37 mmHg (ref 30.0–45.0)
pO2, Ven: 38 mmHg (ref 30.0–45.0)

## 2015-08-05 LAB — GLUCOSE, CAPILLARY: GLUCOSE-CAPILLARY: 115 mg/dL — AB (ref 65–99)

## 2015-08-05 SURGERY — RIGHT/LEFT HEART CATH AND CORONARY ANGIOGRAPHY

## 2015-08-05 MED ORDER — VERAPAMIL HCL 2.5 MG/ML IV SOLN
INTRAVENOUS | Status: AC
  Filled 2015-08-05: qty 2

## 2015-08-05 MED ORDER — HEPARIN SODIUM (PORCINE) 1000 UNIT/ML IJ SOLN
INTRAMUSCULAR | Status: DC | PRN
  Administered 2015-08-05: 5000 [IU] via INTRAVENOUS

## 2015-08-05 MED ORDER — SODIUM CHLORIDE 0.9 % IJ SOLN: 3.0000 mL | Freq: Two times a day (BID) | INTRAMUSCULAR | Status: DC

## 2015-08-05 MED ORDER — IOHEXOL 350 MG/ML SOLN
INTRAVENOUS | Status: DC | PRN
  Administered 2015-08-05: 100 mL via INTRA_ARTERIAL

## 2015-08-05 MED ORDER — MIDAZOLAM HCL 2 MG/2ML IJ SOLN
INTRAMUSCULAR | Status: AC
  Filled 2015-08-05: qty 4

## 2015-08-05 MED ORDER — HEPARIN (PORCINE) IN NACL 2-0.9 UNIT/ML-% IJ SOLN
INTRAMUSCULAR | Status: AC
  Filled 2015-08-05: qty 1000

## 2015-08-05 MED ORDER — VERAPAMIL HCL 2.5 MG/ML IV SOLN
INTRAVENOUS | Status: DC | PRN
  Administered 2015-08-05: 09:00:00 via INTRA_ARTERIAL

## 2015-08-05 MED ORDER — FENTANYL CITRATE (PF) 100 MCG/2ML IJ SOLN
INTRAMUSCULAR | Status: DC | PRN
  Administered 2015-08-05: 25 ug via INTRAVENOUS

## 2015-08-05 MED ORDER — SODIUM CHLORIDE 0.9 % IV SOLN: 250.0000 mL | INTRAVENOUS | Status: DC | PRN

## 2015-08-05 MED ORDER — HEPARIN (PORCINE) IN NACL 2-0.9 UNIT/ML-% IJ SOLN
INTRAMUSCULAR | Status: DC | PRN
  Administered 2015-08-05: 09:00:00

## 2015-08-05 MED ORDER — ACETAMINOPHEN 325 MG PO TABS: 650.0000 mg | ORAL_TABLET | ORAL | Status: DC | PRN

## 2015-08-05 MED ORDER — LIDOCAINE HCL (PF) 1 % IJ SOLN
INTRAMUSCULAR | Status: AC
  Filled 2015-08-05: qty 30

## 2015-08-05 MED ORDER — LIDOCAINE HCL (PF) 1 % IJ SOLN
INTRAMUSCULAR | Status: DC | PRN
  Administered 2015-08-05: 10 mL via INTRADERMAL

## 2015-08-05 MED ORDER — SODIUM CHLORIDE 0.9 % IV SOLN
INTRAVENOUS | Status: DC
  Administered 2015-08-05: 08:00:00 via INTRAVENOUS

## 2015-08-05 MED ORDER — SODIUM CHLORIDE 0.9 % IJ SOLN: 3.0000 mL | INTRAMUSCULAR | Status: DC | PRN

## 2015-08-05 MED ORDER — FENTANYL CITRATE (PF) 100 MCG/2ML IJ SOLN
INTRAMUSCULAR | Status: AC
  Filled 2015-08-05: qty 4

## 2015-08-05 MED ORDER — ONDANSETRON HCL 4 MG/2ML IJ SOLN: 4.0000 mg | Freq: Four times a day (QID) | INTRAMUSCULAR | Status: DC | PRN

## 2015-08-05 MED ORDER — SODIUM CHLORIDE 0.9 % WEIGHT BASED INFUSION: 1.0000 mL/kg/h | INTRAVENOUS | Status: DC

## 2015-08-05 MED ORDER — ASPIRIN 81 MG PO CHEW
CHEWABLE_TABLET | ORAL | Status: AC
  Administered 2015-08-05: 81 mg via ORAL
  Filled 2015-08-05: qty 1

## 2015-08-05 MED ORDER — SODIUM CHLORIDE 0.9 % IJ SOLN
3.0000 mL | Freq: Two times a day (BID) | INTRAMUSCULAR | Status: DC
Start: 2015-08-05 — End: 2015-08-05

## 2015-08-05 MED ORDER — NITROGLYCERIN 1 MG/10 ML FOR IR/CATH LAB
INTRA_ARTERIAL | Status: AC
  Filled 2015-08-05: qty 10

## 2015-08-05 MED ORDER — MIDAZOLAM HCL 2 MG/2ML IJ SOLN
INTRAMUSCULAR | Status: DC | PRN
  Administered 2015-08-05: 1 mg via INTRAVENOUS

## 2015-08-05 MED ORDER — ASPIRIN 81 MG PO CHEW
81.0000 mg | CHEWABLE_TABLET | ORAL | Status: AC
  Administered 2015-08-05: 81 mg via ORAL

## 2015-08-05 MED ORDER — HEPARIN SODIUM (PORCINE) 1000 UNIT/ML IJ SOLN
INTRAMUSCULAR | Status: AC
  Filled 2015-08-05: qty 1

## 2015-08-05 SURGICAL SUPPLY — 15 items
CATH BALLN WEDGE 5F 110CM (CATHETERS) ×3 IMPLANT
CATH INFINITI 5 FR 3DRC (CATHETERS) ×3 IMPLANT
CATH INFINITI 5 FR JL3.5 (CATHETERS) ×3 IMPLANT
CATH INFINITI 5FR ANG PIGTAIL (CATHETERS) ×3 IMPLANT
CATH INFINITI JR4 5F (CATHETERS) ×3 IMPLANT
DEVICE RAD COMP TR BAND LRG (VASCULAR PRODUCTS) ×3 IMPLANT
GLIDESHEATH SLEND SS 6F .021 (SHEATH) ×3 IMPLANT
KIT HEART LEFT (KITS) ×3 IMPLANT
PACK CARDIAC CATHETERIZATION (CUSTOM PROCEDURE TRAY) ×3 IMPLANT
SHEATH FAST CATH BRACH 5F 5CM (SHEATH) ×3 IMPLANT
SYR MEDRAD MARK V 150ML (SYRINGE) ×3 IMPLANT
TRANSDUCER W/STOPCOCK (MISCELLANEOUS) ×3 IMPLANT
TUBING CIL FLEX 10 FLL-RA (TUBING) ×3 IMPLANT
WIRE HI TORQ VERSACORE-J 145CM (WIRE) ×3 IMPLANT
WIRE SAFE-T 1.5MM-J .035X260CM (WIRE) ×3 IMPLANT

## 2015-08-05 NOTE — Discharge Instructions (Signed)
Radial Site Care °Refer to this sheet in the next few weeks. These instructions provide you with information about caring for yourself after your procedure. Your health care provider may also give you more specific instructions. Your treatment has been planned according to current medical practices, but problems sometimes occur. Call your health care provider if you have any problems or questions after your procedure. °WHAT TO EXPECT AFTER THE PROCEDURE °After your procedure, it is typical to have the following: °· Bruising at the radial site that usually fades within 1-2 weeks. °· Blood collecting in the tissue (hematoma) that may be painful to the touch. It should usually decrease in size and tenderness within 1-2 weeks. °HOME CARE INSTRUCTIONS °· Take medicines only as directed by your health care provider. °· You may shower 24-48 hours after the procedure or as directed by your health care provider. Remove the bandage (dressing) and gently wash the site with plain soap and water. Pat the area dry with a clean towel. Do not rub the site, because this may cause bleeding. °· Do not take baths, swim, or use a hot tub until your health care provider approves. °· Check your insertion site every day for redness, swelling, or drainage. °· Do not apply powder or lotion to the site. °· Do not flex or bend the affected arm for 24 hours or as directed by your health care provider. °· Do not push or pull heavy objects with the affected arm for 24 hours or as directed by your health care provider. °· Do not lift over 10 lb (4.5 kg) for 5 days after your procedure or as directed by your health care provider. °· Ask your health care provider when it is okay to: °¨ Return to work or school. °¨ Resume usual physical activities or sports. °¨ Resume sexual activity. °· Do not drive home if you are discharged the same day as the procedure. Have someone else drive you. °· You may drive 24 hours after the procedure unless otherwise  instructed by your health care provider. °· Do not operate machinery or power tools for 24 hours after the procedure. °· If your procedure was done as an outpatient procedure, which means that you went home the same day as your procedure, a responsible adult should be with you for the first 24 hours after you arrive home. °· Keep all follow-up visits as directed by your health care provider. This is important. °SEEK MEDICAL CARE IF: °· You have a fever. °· You have chills. °· You have increased bleeding from the radial site. Hold pressure on the site and call 911. °SEEK IMMEDIATE MEDICAL CARE IF: °· You have unusual pain at the radial site. °· You have redness, warmth, or swelling at the radial site. °· You have drainage (other than a small amount of blood on the dressing) from the radial site. °· The radial site is bleeding, and the bleeding does not stop after 30 minutes of holding steady pressure on the site. °· Your arm or hand becomes pale, cool, tingly, or numb. °  °This information is not intended to replace advice given to you by your health care provider. Make sure you discuss any questions you have with your health care provider. °  °Document Released: 11/19/2010 Document Revised: 11/07/2014 Document Reviewed: 05/05/2014 °Elsevier Interactive Patient Education ©2016 [employer] Inc[employer] ° °

## 2015-08-05 NOTE — Research (Signed)
CAD LAD Informed Consent   Subject Name: Kevin Reese  Subject met inclusion and exclusion criteria.  The informed consent form, study requirements and expectations were reviewed with the subject and questions and concerns were addressed prior to the signing of the consent form.  The subject verbalized understanding of the trail requirements.  The subject agreed to participate in the CAD LAD trial and signed the informed consent.  The informed consent was obtained prior to performance of any protocol-specific procedures for the subject.  A copy of the signed informed consent was given to the subject and a copy was placed in the subject's medical record.  Shawn Lord 08/05/2015, 07:18 AM  

## 2015-08-05 NOTE — H&P (View-Only) (Signed)
Patient ID: Kevin Reese, male   DOB: October 17, 1981, 34 y.o.   MRN: [id number] Oncologist: Dr. Marin Olp Cardiologist: Dr. Aundra Dubin  34 yo with history of HTN, diabetes, Hodgkins disease, CKD, and chronic systolic CHF presents for CHF clinic evaluation.  Patient has had HTN since at least 2013.  It has only been sporadically treated.  He has a diagnosis of diabetes but is not on meds. He was treated for Hodgkins disease in 2013 with ABVD which contains doxorubicin.  He was treated in [employer], and does not appear to have had a prior echo done here. He was a heavy drinker in the past, he has cut this back to about a 6-pack a week.    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  Finally, he was admitted in 8/16 to a hospital in [place] (had been up there for work).  EF was found to be 20% and he had pulmonary edema.  BP was > 200/100 at admission (he had not been on BP meds).  He was diuresed and BP was controlled.  He was sent home on a regimen of cardiac and BP meds.    Today, BP is controlled. He is taking his meds.  He is short of breath walking about 100 yards or walking up stairs.  No orthopnea/PND.  No chest pain.  No lightheadedness or syncope. No palpitations.  He snores loudly and has daytime sleepiness.   ECG: NSR, normal  Labs (8/16): K 4.9, creatinine 1.72  PMH: 1. HTN: Since at least 2013. 2. Prior ETOH abuse 3. Type II diabetes: Not on meds currently.  4. Low back pain: h/o disc herniations, s/p back surgery x 2.  5. Stage IIa nodular sclerosing Hodgkin's disease: 4 cycles ABVD (doxorubicin, bleomycin, vinblastine, dacarbazine) in 4/13.  Now in remission.  6. CKD 7. 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  8. Chronic systolic CHF: 4/70 admission to hospital in [place] with acute systolic CHF. Echo showed EF 20%.  He was diuresed and discharged home.    SH:  Nonsmoker (quit in 2013), lives with girlfriend in [place], out of work Nature conservation officer.  Prior heavy ETOH, has cut back a lot (now drinks a 6 pack in a week).   FH: Grandfather with MI in his 4s and died at 60 from [place].  1st cousin with "99% blockage" in her 24s.  No siblings.   ROS: All systems reviewed and negative except as per HPI.   Current Outpatient Prescriptions  Medication Sig Dispense Refill  . amLODipine (NORVASC) 10 MG tablet Take 10 mg by mouth daily.    . carvedilol (COREG) 25 MG tablet Take 25 mg by mouth 2 (two) times daily with a meal.    . clonazePAM (KLONOPIN) 1 MG tablet Take 1 mg by mouth 2 (two) times daily as needed for anxiety.    . folic acid (FOLVITE) 1 MG tablet Take 1 mg by mouth daily.    . hydrALAZINE (APRESOLINE) 50 MG tablet Take 50 mg by mouth 3 (three) times daily.    . Hydrocodone-Acetaminophen (VICODIN) 5-300 MG TABS Take 1-2 tablets by mouth every 6 (six) hours as needed (pain).    Marland Kitchen ibuprofen (ADVIL,MOTRIN) 600 MG tablet Take 600 mg by mouth every 6 (six) hours as needed for mild pain.    Marland Kitchen lisinopril (PRINIVIL,ZESTRIL) 5 MG tablet Take 2.5 mg by mouth every morning.     Marland Kitchen  thiamine 100 MG tablet Take 100 mg by mouth daily.    . furosemide (LASIX) 20 MG tablet Take 2 tablets (40 mg total) by mouth 2 (two) times daily. 60 tablet 3  . isosorbide mononitrate (IMDUR) 60 MG 24 hr tablet Take 1 tablet (60 mg total) by mouth daily. 90 tablet 3   No current facility-administered medications for this encounter.   BP 120/64 mmHg  Pulse 68  Wt 235 lb 12.8 oz (106.958 kg)  SpO2 99% General: NAD Neck: No JVD, no thyromegaly or thyroid nodule.  Lungs: Clear to auscultation bilaterally with normal respiratory effort. CV: Nondisplaced PMI.  Heart regular S1/S2, no S3/S4, no murmur.  No peripheral edema.  No carotid bruit.  Normal pedal pulses.  Abdomen: Soft, nontender, no hepatosplenomegaly, no distention.  Skin: Intact without lesions or rashes.   Neurologic: Alert and oriented x 3.  Psych: Normal affect. Extremities: No clubbing or cyanosis.  HEENT: Normal.   Assessment/Plan: 1. Chronic systolic CHF: EF 00% by echo in [place].  NYHA class II-III symptoms currently.  He is not volume overloaded on exam.  Etiology of cardiomyopathy is uncertain.  He has multiple reasons for fall in EF.  He had chemotherapy including doxorubicin back in 2013.  He has had uncontrolled HTN apparently for at least several years.  He drank heavily up to 8/16 admission.  Finally, he has a family history of premature CAD.  He has not had chest pain.  - I encouraged him to cut back as much as possible on ETOH => he has already cut down to about a 6 pack a week.  - BP is much better controlled on current regimen.   - I will plan for a cardiac MRI to assess for infiltrative disease or prior myocarditis or MI, also to reassess LV and RV function.  - I will plan RHC/LHC to rule out CAD as a cause for his cardiomyopathy and to assess filling pressures and cardiac output.  He is only 13 but grandfather had multiple MIs in his 36s and 1st cousin just had "99% blockage" found and she is in her 48s.  He has diabetes and HTN.  We discussed risks/benefits of the procedure and he agrees to proceed.  - Continue current Coreg.  - Continue hydralazine 50 mg tid but will transition from Isordil to Imdur 60 mg daily, as Imdur is covered by our heart failure fund and we can get this medication for him.  - Continue current lisinopril 2.5 mg daily for now until we repeat BMET, will try to titrate up if creatinine remains stable.  - Stop Bumex and start Lasix 40 mg bid (Lasix covered by heart failure fund and not Bumex).   - Check BMET/BNP and TSH.  2. Suspected OSA: Will arrange for sleep study.  3. HTN: BP now controlled.  Continue current regimen.  4. Type II diabetes: This has been diagnosed but he is on no meds.  Check HgbA1c and refer to [facility].   5. Patient has no insurance.  I will have our social worker talk to him today.  He needs help with Medicaid. Will also see if we can get him an orange card to get the above procedures covered.   [name] 07/27/2015

## 2015-08-05 NOTE — Interval H&P Note (Signed)
Cath Lab Visit (complete for each Cath Lab visit)  Clinical Evaluation Leading to the Procedure:   ACS: No.  Non-ACS:    Anginal Classification: CCS III  Anti-ischemic medical therapy: Minimal Therapy (1 class of medications)  Non-Invasive Test Results: No non-invasive testing performed  Prior CABG: No previous CABG      History and Physical Interval Note:  08/05/2015 8:44 AM  Kevin Reese  has presented today for surgery, with the diagnosis of cm  The various methods of treatment have been discussed with the patient and family. After consideration of risks, benefits and other options for treatment, the patient has consented to  Procedure(s): Right/Left Heart Cath and Coronary Angiography (N/A) as a surgical intervention .  The patient's history has been reviewed, patient examined, no change in status, stable for surgery.  I have reviewed the patient's chart and labs.  Questions were answered to the patient's satisfaction.     Kevin Reese [employer]

## 2015-08-07 ENCOUNTER — Ambulatory Visit (HOSPITAL_COMMUNITY): Payer: Self-pay

## 2015-08-14 ENCOUNTER — Ambulatory Visit (HOSPITAL_COMMUNITY)
Admission: RE | Admit: 2015-08-14 | Discharge: 2015-08-14 | Disposition: A | Discharge status: Home / Self Care | Payer: MEDICAID | Source: Ambulatory Visit | Attending: Cardiology | Admitting: Cardiology

## 2015-08-14 ENCOUNTER — Encounter (HOSPITAL_COMMUNITY): Payer: Self-pay

## 2015-08-14 DIAGNOSIS — I429 Cardiomyopathy, unspecified: Secondary | ICD-10-CM | POA: Insufficient documentation

## 2015-08-14 DIAGNOSIS — I5022 Chronic systolic (congestive) heart failure: Secondary | ICD-10-CM

## 2015-08-14 DIAGNOSIS — I517 Cardiomegaly: Secondary | ICD-10-CM | POA: Insufficient documentation

## 2015-08-14 DIAGNOSIS — I5189 Other ill-defined heart diseases: Secondary | ICD-10-CM | POA: Insufficient documentation

## 2015-08-14 MED ORDER — GADOBENATE DIMEGLUMINE 529 MG/ML IV SOLN
40.0000 mL | Freq: Once | INTRAVENOUS | Status: AC
  Administered 2015-08-14: 38 mL via INTRAVENOUS

## 2015-08-17 ENCOUNTER — Encounter (HOSPITAL_COMMUNITY): Payer: Self-pay

## 2015-08-17 ENCOUNTER — Ambulatory Visit (HOSPITAL_COMMUNITY)
Admission: RE | Admit: 2015-08-17 | Discharge: 2015-08-17 | Disposition: A | Discharge status: Home / Self Care | Payer: Self-pay | Source: Ambulatory Visit | Attending: Cardiology | Admitting: Cardiology

## 2015-08-17 VITALS — BP 146/92 | HR 86 | Wt 237.8 lb

## 2015-08-17 DIAGNOSIS — I5022 Chronic systolic (congestive) heart failure: Secondary | ICD-10-CM

## 2015-08-17 DIAGNOSIS — Z79899 Other long term (current) drug therapy: Secondary | ICD-10-CM | POA: Insufficient documentation

## 2015-08-17 DIAGNOSIS — Z8249 Family history of ischemic heart disease and other diseases of the circulatory system: Secondary | ICD-10-CM | POA: Insufficient documentation

## 2015-08-17 DIAGNOSIS — N182 Chronic kidney disease, stage 2 (mild): Secondary | ICD-10-CM

## 2015-08-17 DIAGNOSIS — Z7982 Long term (current) use of aspirin: Secondary | ICD-10-CM | POA: Insufficient documentation

## 2015-08-17 DIAGNOSIS — C8110 Nodular sclerosis classical Hodgkin lymphoma, unspecified site: Secondary | ICD-10-CM | POA: Insufficient documentation

## 2015-08-17 DIAGNOSIS — E785 Hyperlipidemia, unspecified: Secondary | ICD-10-CM | POA: Insufficient documentation

## 2015-08-17 DIAGNOSIS — Z9221 Personal history of antineoplastic chemotherapy: Secondary | ICD-10-CM | POA: Insufficient documentation

## 2015-08-17 DIAGNOSIS — I428 Other cardiomyopathies: Secondary | ICD-10-CM | POA: Insufficient documentation

## 2015-08-17 DIAGNOSIS — E119 Type 2 diabetes mellitus without complications: Secondary | ICD-10-CM | POA: Insufficient documentation

## 2015-08-17 DIAGNOSIS — Z87891 Personal history of nicotine dependence: Secondary | ICD-10-CM | POA: Insufficient documentation

## 2015-08-17 DIAGNOSIS — I10 Essential (primary) hypertension: Secondary | ICD-10-CM

## 2015-08-17 LAB — BASIC METABOLIC PANEL
Anion gap: 10 (ref 5–15)
BUN: 14 mg/dL (ref 6–20)
CALCIUM: 9.9 mg/dL (ref 8.9–10.3)
CHLORIDE: 100 mmol/L — AB (ref 101–111)
CO2: 27 mmol/L (ref 22–32)
CREATININE: 1.16 mg/dL (ref 0.61–1.24)
GFR calc non Af Amer: >60 mL/min (ref >60)
Glucose, Bld: 147 mg/dL — ABNORMAL HIGH (ref 65–99)
Potassium: 3.9 mmol/L (ref 3.5–5.1)
SODIUM: 137 mmol/L (ref 135–145)

## 2015-08-17 MED ORDER — SACUBITRIL-VALSARTAN 24-26 MG PO TABS: 1.0000 | ORAL_TABLET | Freq: Two times a day (BID) | ORAL | Status: DC

## 2015-08-17 MED ORDER — FISH OIL 1000 MG PO CAPS: 2000.0000 mg | ORAL_CAPSULE | Freq: Two times a day (BID) | ORAL | Status: DC

## 2015-08-17 MED ORDER — AMLODIPINE BESYLATE 10 MG PO TABS: 10.0000 mg | ORAL_TABLET | Freq: Every day | ORAL | Status: DC

## 2015-08-17 NOTE — Progress Notes (Signed)
Medication Samples have been provided to the patient.  Drug name: Kevin Reese: 28  LOT: [id number]  Exp.Date: 01/2016  The patient has been instructed regarding the correct time, dose, and frequency of taking this medication, including desired effects and most common side effects.   [name] D 12:39 PM 08/17/2015

## 2015-08-17 NOTE — Progress Notes (Signed)
Advanced Heart Failure Medication Review by a Pharmacist  Does the patient  feel that his/her medications are working for him/her?  yes  Has the patient been experiencing any side effects to the medications prescribed?  no  Does the patient measure his/her own blood pressure or blood glucose at home?  no   Does the patient have any problems obtaining medications due to transportation or finances?   yes  Understanding of regimen: good Understanding of indications: good Potential of compliance: good Patient understands to avoid NSAIDs. Patient understands to avoid decongestants.  Issues to address at subsequent visits: Medicaid status   Pharmacist comments:  Kevin Reese is a pleasant 34 yo M presenting with his medication bottles. He reports good compliance with his medications but states that he has not been taking his fenofibrate due to the cost of this medication. I verified with our Outpatient Pharmacy that the cost would be $39.50/month. They do carry OTC fish oil 1000 mg tablets at $4 for 100 tablets. Will make him aware of these costs. He did not have any other medication-related questions or concerns for me at this time.  Ruta Hinds. Velva Harman, PharmD, BCPS, CPP Clinical Pharmacist Pager: [phone] Phone: [phone] 08/17/2015 11:57 AM      Time with patient: 6 minutes Preparation and documentation time: 6 minutes Total time: 12 minutes

## 2015-08-17 NOTE — Patient Instructions (Signed)
Stop Lisinopril and the Fenofibrate  Start Entresto 24/26 mg twice a day on Thursday at bedtime Will work on getting you on the assistance plan  Labs today will call if abnormal   Labs in 2 weeks  Refills into OP pharmacy

## 2015-08-18 DIAGNOSIS — N182 Chronic kidney disease, stage 2 (mild): Secondary | ICD-10-CM | POA: Insufficient documentation

## 2015-08-18 NOTE — Progress Notes (Signed)
Patient ID: Kevin Reese, male   DOB: 1981/08/31, 34 y.o.   MRN: [id number] Oncologist: Dr. Marin Olp Cardiologist: Dr. Aundra Dubin  33 yo with history of HTN, diabetes, Hodgkins disease, CKD, and chronic systolic CHF presents for CHF clinic evaluation.  Patient has had HTN since at least 2013.  It has only been sporadically treated.  He has a diagnosis of diabetes but is not on meds. He was treated for Hodgkins disease in 2013 with ABVD which contains doxorubicin.  He was treated in [place], and does not appear to have had a prior echo done here. He was a heavy drinker in the past, he has cut this back to about a 6-pack a week at most.    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  Finally, he was admitted in 8/16 to a hospital in [place] (had been up there for work).  EF was found to be 20% and he had pulmonary edema.  BP was > 200/100 at admission (he had not been on BP meds).  He was diuresed and BP was controlled.  He was sent home on a regimen of cardiac and BP meds.    I had him do LHC/RHC in 10/16.  This showed normal filling pressures and cardiac output, no significant coronary disease.  Cardiac MRI in 10/16 showed LV EF 38% with normal RV size and RV EF 34%.    He is currently doing better.  BP is mildly elevated still today.  He is walking longer distances without dyspnea.  No lightheadedness.  No orthopnea/PND.  No chest pain.  Triglycerides very high when lipids checked.  He is not able to afford fenofibrate.   ECG: NSR, normal  Labs (8/16): K 4.9, creatinine 1.72 Labs (9/16): BNP 61, TGs 493, HgbA1c 7.1, TSH normal, K 4.3, creatinine 1.3  PMH: 1. HTN: Since at least 2013. 2. Prior ETOH abuse 3. Type II diabetes: Not on meds currently.  4. Low back pain: h/o disc herniations, s/p back surgery x 2.  5. Stage IIa nodular sclerosing Hodgkin's disease: 4 cycles ABVD  (doxorubicin, bleomycin, vinblastine, dacarbazine) in 4/13.  Now in remission.  6. CKD 7. 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  8. Chronic systolic CHF: Nonischemic cardiomyopathy.  8/16 admission to hospital in [place] with acute systolic CHF. Echo showed EF 20%.  He was diuresed and discharged home.  LHC/RHC (10/16) with no significant coronary disease; mean RA 4, PA 29/14, mean PCWP 10, CI 2.21.  Cardiac MRI (10/16) with EF 38%, mild to moderate LVH, normal RV size with RV EF 34%, no late gadolinium enhancement.   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  Prior heavy ETOH, has cut back a lot (now drinks a 6 pack in a week).   FH: Grandfather with MI in his 75s and died at 57 from [place].  1st cousin with "99% blockage" in her 54s.  No siblings.   ROS: All systems reviewed and negative except as per HPI.   Current Outpatient Prescriptions  Medication Sig Dispense Refill  . acetaminophen (TYLENOL) 500 MG tablet Take 500 mg by mouth every 6 (six) hours as needed for mild pain.    Marland Kitchen amLODipine (NORVASC) 10 MG tablet Take 1 tablet (10 mg total) by mouth daily. 30 tablet 3  . aspirin 81 MG tablet Take 81 mg by mouth daily.    Marland Kitchen  carvedilol (COREG) 25 MG tablet Take 25 mg by mouth 2 (two) times daily with a meal.    . clonazePAM (KLONOPIN) 1 MG tablet Take 1 mg by mouth 2 (two) times daily as needed for anxiety.    . folic acid (FOLVITE) 1 MG tablet Take 1 mg by mouth daily.    . furosemide (LASIX) 40 MG tablet Take 40 mg by mouth 2 (two) times daily.    . hydrALAZINE (APRESOLINE) 50 MG tablet Take 50 mg by mouth 3 (three) times daily.    . Hydrocodone-Acetaminophen (VICODIN) 5-300 MG TABS Take 1-2 tablets by mouth every 6 (six) hours as needed (pain).    . isosorbide mononitrate (IMDUR) 60 MG 24 hr tablet Take 1 tablet (60 mg total) by mouth daily. 90 tablet 3  . Multiple Vitamins-Minerals (MULTIVITAMIN WITH MINERALS)  tablet Take 1 tablet by mouth daily.    Marland Kitchen thiamine 100 MG tablet Take 100 mg by mouth daily.    . Omega-3 Fatty Acids (FISH OIL) 1000 MG CAPS Take 2 capsules (2,000 mg total) by mouth 2 (two) times daily. 60 capsule 0  . sacubitril-valsartan (ENTRESTO) 24-26 MG Take 1 tablet by mouth 2 (two) times daily. 60 tablet 3   No current facility-administered medications for this encounter.   BP 146/92 mmHg  Pulse 86  Wt 237 lb 12.8 oz (107.865 kg)  SpO2 96% General: NAD Neck: No JVD, no thyromegaly or thyroid nodule.  Lungs: Clear to auscultation bilaterally with normal respiratory effort. CV: Nondisplaced PMI.  Heart regular S1/S2, no S3/S4, no murmur.  No peripheral edema.  No carotid bruit.  Normal pedal pulses.  Abdomen: Soft, nontender, no hepatosplenomegaly, no distention.  Skin: Intact without lesions or rashes.  Neurologic: Alert and oriented x 3.  Psych: Normal affect. Extremities: No clubbing or cyanosis.  HEENT: Normal.   Assessment/Plan: 1. Chronic systolic CHF: Nonischemic cardiomyopathy.  EF 20% by echo in [place], 38% on cardiac MRI in 10/16.  NYHA class II symptoms currently.  He is not volume overloaded on exam.  Etiology of cardiomyopathy is uncertain.  He has multiple reasons for fall in EF.  He had chemotherapy including doxorubicin back in 2013.  He has had uncontrolled HTN apparently for at least several years.  He drank heavily up to 8/16 admission. No late gadolinium enhancement on cardiac MRI.  - I encouraged him to cut back as much as possible on ETOH => he has already cut down to about a 6 pack a week.  - BP is much better controlled on current regimen.   - I will plan for a cardiac MRI to assess for infiltrative disease or prior myocarditis or MI, also to reassess LV and RV function.  - Continue current Coreg.  - Continue hydralazine 50 mg tid/Imdur 60 mg daily. - Improved creatinine, stop lisinopril and start Entresto 24/26 bid in 36 hours.  BMET today and in 2 wks.   - Continue Lasix 40 mg bid.  2. Suspected OSA: Will arrange for sleep study.  3. HTN: BP better controlled.  Changing over to [facility] as above. 4. Type II diabetes: This has been diagnosed but he is on no meds.  He has a PCP appointment.  5. Hyperlipidemia: Elevated triglycerides.  He cannot afford fenofibrate.  I will have him start fish oil 2 g bid.  6. OK to return to work with light duties.  [name] 08/18/2015

## 2015-08-21 ENCOUNTER — Ambulatory Visit (INDEPENDENT_AMBULATORY_CARE_PROVIDER_SITE_OTHER): Payer: Self-pay | Admitting: Internal Medicine

## 2015-08-21 ENCOUNTER — Encounter: Payer: Self-pay | Admitting: Internal Medicine

## 2015-08-21 VITALS — BP 131/76 | HR 76 | Temp 97.7°F | Ht 71.0 in | Wt 243.6 lb

## 2015-08-21 DIAGNOSIS — T451X5A Adverse effect of antineoplastic and immunosuppressive drugs, initial encounter: Secondary | ICD-10-CM

## 2015-08-21 DIAGNOSIS — G629 Polyneuropathy, unspecified: Secondary | ICD-10-CM

## 2015-08-21 DIAGNOSIS — G620 Drug-induced polyneuropathy: Secondary | ICD-10-CM

## 2015-08-21 DIAGNOSIS — E119 Type 2 diabetes mellitus without complications: Secondary | ICD-10-CM

## 2015-08-21 DIAGNOSIS — I110 Hypertensive heart disease with heart failure: Secondary | ICD-10-CM

## 2015-08-21 DIAGNOSIS — F419 Anxiety disorder, unspecified: Secondary | ICD-10-CM

## 2015-08-21 DIAGNOSIS — C8192 Hodgkin lymphoma, unspecified, intrathoracic lymph nodes: Secondary | ICD-10-CM

## 2015-08-21 DIAGNOSIS — Z0000 Encounter for general adult medical examination without abnormal findings: Secondary | ICD-10-CM

## 2015-08-21 DIAGNOSIS — M5442 Lumbago with sciatica, left side: Secondary | ICD-10-CM

## 2015-08-21 DIAGNOSIS — I10 Essential (primary) hypertension: Secondary | ICD-10-CM

## 2015-08-21 DIAGNOSIS — I5022 Chronic systolic (congestive) heart failure: Secondary | ICD-10-CM

## 2015-08-21 NOTE — Patient Instructions (Signed)
-   We will try lifestyle modifications first for your diabetes - Increase walking to 20-30 minutes at least 3-4 times per week - Increase vegetable and fruit intake. Try to avoid fast foods, sweets, and white bread, rice, or crackers. Go for whole grains. - Please sign record release form up front.  General Instructions:   Thank you for bringing your medicines today. This helps [place] keep you safe from mistakes.   Progress Toward Treatment Goals:  No flowsheet data found.  Self Care Goals & Plans:  Self Care Goal 08/21/2015  Manage my medications take my medicines as prescribed; bring my medications to every visit; refill my medications on time  Eat healthy foods drink diet soda or water instead of juice or soda; eat more vegetables; eat foods that are low in salt; eat baked foods instead of fried foods; eat fruit for snacks and desserts  Meeting treatment goals maintain the current self-care plan    No flowsheet data found.   Care Management & Community Referrals:  No flowsheet data found.
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
## 2015-08-23 DIAGNOSIS — M5442 Lumbago with sciatica, left side: Secondary | ICD-10-CM | POA: Insufficient documentation

## 2015-08-23 DIAGNOSIS — G620 Drug-induced polyneuropathy: Secondary | ICD-10-CM | POA: Insufficient documentation

## 2015-08-23 DIAGNOSIS — F419 Anxiety disorder, unspecified: Secondary | ICD-10-CM | POA: Insufficient documentation

## 2015-08-23 DIAGNOSIS — Z0000 Encounter for general adult medical examination without abnormal findings: Secondary | ICD-10-CM | POA: Insufficient documentation

## 2015-08-23 DIAGNOSIS — F1011 Alcohol abuse, in remission: Secondary | ICD-10-CM | POA: Insufficient documentation

## 2015-08-23 DIAGNOSIS — T451X5A Adverse effect of antineoplastic and immunosuppressive drugs, initial encounter: Secondary | ICD-10-CM

## 2015-08-23 DIAGNOSIS — E119 Type 2 diabetes mellitus without complications: Secondary | ICD-10-CM | POA: Insufficient documentation

## 2015-08-23 NOTE — Assessment & Plan Note (Signed)
BP Readings from Last 3 Encounters:  08/21/15 131/76  08/17/15 146/92  08/05/15 125/80    Lab Results  Component Value Date   NA 137 08/17/2015   K 3.9 08/17/2015   CREATININE 1.16 08/17/2015    Assessment: Blood pressure control: controlled Progress toward BP goal:  at goal Comments: Well controlled.   Plan: Medications: Continue Amlodipine 10 mg daily, Coreg 25 mg BID, Lasix 40 mg daily, Hydralazine 50 mg TID, and Entresto 24-26 mg BID.

## 2015-08-23 NOTE — Assessment & Plan Note (Signed)
Follows with Dr. Toy Care. He is currently taking Klonopin 1 mg BID. I encouraged him to continue getting this medication from Dr. Toy Care.  - Can discuss tapering off Klonopin and trying a different SSRI (did not respond well to Zoloft)

## 2015-08-23 NOTE — Assessment & Plan Note (Addendum)
No evidence of volume overload on exam. Encouraged walking at least 3-4 times per week and weight loss. Follows with Dr. Aundra Dubin. - Continue ASA, Coreg, Lasix, Entresto, and other BP meds

## 2015-08-23 NOTE — Assessment & Plan Note (Signed)
Received ABVD therapy in 2013. Currently in remission. Follows with Dr. Marin Olp.

## 2015-08-23 NOTE — Assessment & Plan Note (Signed)
Likely secondary to vinblastine treatment for his Hodgkin's lymphoma. Currently well managed without medication. Continue to monitor.
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
## 2015-08-23 NOTE — Assessment & Plan Note (Addendum)
Lab Results  Component Value Date   HGBA1C 7.1* 07/27/2015     Assessment: Diabetes control: fair control Progress toward A1C goal:  unable to assess Comments: Newly diagnosed. Will start with lifestyle modifications and recheck HbA1c in 3 months. If HbA1c still elevated will start on Metformin.   Plan: Medications:  None- lifestyle modifications Home glucose monitoring: Frequency: no home glucose monitoring Instruction/counseling given: reminded to bring medications to each visit, discussed the need for weight loss and discussed diet Other plans:  - Encouraged patient to increase walking to 3-4 times per week - Encouraged healthy eating by increasing vegetable and fruit intake and eating complex carbs (wheat bread, brown rice) and avoiding sweets, simple carbs (white breads, white rice, etc), fast foods - Will recheck HbA1c in 3 months - Urine microalbumin next visit

## 2015-08-24 NOTE — Progress Notes (Signed)
Internal Medicine Clinic Attending  Case discussed with Dr. Rivet soon after the resident saw the patient.  We reviewed the resident's history and exam and pertinent patient test results.  I agree with the assessment, diagnosis, and plan of care documented in the resident's note.  

## 2015-08-25 ENCOUNTER — Telehealth: Payer: Self-pay | Admitting: Hematology & Oncology

## 2015-08-25 ENCOUNTER — Telehealth (HOSPITAL_COMMUNITY): Payer: Self-pay

## 2015-08-25 NOTE — Telephone Encounter (Signed)
Faxed medical records to: [facility] [place] F: [phone] P: [phone]  Case: [id number]   Req: 05/2014 to current    COPY SCANNED

## 2015-08-25 NOTE — Telephone Encounter (Signed)
[employer] Patient Assistance Program called requesting missing RX section on their form.  For the [employer]   [phone]

## 2015-08-31 ENCOUNTER — Ambulatory Visit (HOSPITAL_COMMUNITY)
Admission: RE | Admit: 2015-08-31 | Discharge: 2015-08-31 | Disposition: A | Discharge status: Home / Self Care | Payer: Self-pay | Source: Ambulatory Visit | Attending: Cardiology | Admitting: Cardiology

## 2015-08-31 DIAGNOSIS — I5022 Chronic systolic (congestive) heart failure: Secondary | ICD-10-CM | POA: Insufficient documentation

## 2015-08-31 LAB — BASIC METABOLIC PANEL
ANION GAP: 13 (ref 5–15)
BUN: 12 mg/dL (ref 6–20)
CHLORIDE: 100 mmol/L — AB (ref 101–111)
CO2: 26 mmol/L (ref 22–32)
Calcium: 9.8 mg/dL (ref 8.9–10.3)
Creatinine, Ser: 1.4 mg/dL — ABNORMAL HIGH (ref 0.61–1.24)
GFR calc Af Amer: >60 mL/min (ref >60)
GFR calc non Af Amer: >60 mL/min (ref >60)
GLUCOSE: 145 mg/dL — AB (ref 65–99)
POTASSIUM: 4.3 mmol/L (ref 3.5–5.1)
Sodium: 139 mmol/L (ref 135–145)

## 2015-09-18 ENCOUNTER — Encounter (HOSPITAL_COMMUNITY): Payer: Self-pay

## 2015-09-18 ENCOUNTER — Encounter (HOSPITAL_COMMUNITY): Payer: Self-pay | Admitting: Pharmacist

## 2015-09-18 ENCOUNTER — Ambulatory Visit (HOSPITAL_COMMUNITY)
Admission: RE | Admit: 2015-09-18 | Discharge: 2015-09-18 | Disposition: A | Discharge status: Home / Self Care | Payer: Self-pay | Source: Ambulatory Visit | Attending: Cardiology | Admitting: Cardiology

## 2015-09-18 VITALS — BP 136/90 | HR 74 | Wt 236.8 lb

## 2015-09-18 DIAGNOSIS — I5022 Chronic systolic (congestive) heart failure: Secondary | ICD-10-CM

## 2015-09-18 DIAGNOSIS — I10 Essential (primary) hypertension: Secondary | ICD-10-CM

## 2015-09-18 DIAGNOSIS — E119 Type 2 diabetes mellitus without complications: Secondary | ICD-10-CM | POA: Insufficient documentation

## 2015-09-18 DIAGNOSIS — E785 Hyperlipidemia, unspecified: Secondary | ICD-10-CM | POA: Insufficient documentation

## 2015-09-18 DIAGNOSIS — I110 Hypertensive heart disease with heart failure: Secondary | ICD-10-CM | POA: Insufficient documentation

## 2015-09-18 MED ORDER — SACUBITRIL-VALSARTAN 49-51 MG PO TABS: 1.0000 | ORAL_TABLET | Freq: Two times a day (BID) | ORAL | Status: DC

## 2015-09-18 MED ORDER — FUROSEMIDE 40 MG PO TABS: 40.0000 mg | ORAL_TABLET | Freq: Every day | ORAL | Status: DC

## 2015-09-18 MED ORDER — SACUBITRIL-VALSARTAN 24-26 MG PO TABS: 1.0000 | ORAL_TABLET | Freq: Two times a day (BID) | ORAL | Status: DC

## 2015-09-18 NOTE — Patient Instructions (Signed)
Medications:  Increase Entresto 49/51 mg twice a day  Decrease Lasix 40 mg daily  Labs in 10 days  Follow up in 1 month

## 2015-09-19 NOTE — Progress Notes (Addendum)
Patient ID: Kevin Reese, male   DOB: 27-Oct-1981, 34 y.o.   MRN: [id number] Oncologist: Dr. Marin Olp Cardiologist: Dr. Aundra Dubin PCP: Dr. Arcelia Jew  34 yo with history of HTN, diabetes, Hodgkins disease, CKD, and chronic systolic CHF presents for CHF clinic evaluation.  Patient has had HTN since at least 2013.  It has only been sporadically treated.  He has a diagnosis of diabetes but is not on meds. He was treated for Hodgkins disease in 2013 with ABVD which contains doxorubicin.  He was treated in [place], and does not appear to have had a prior echo done here. He was a heavy drinker in the past, he has cut this back to about a 6-pack a week at most.    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  Finally, he was admitted in 8/16 to a hospital in [place] (had been up there for work).  EF was found to be 20% and he had pulmonary edema.  BP was > 200/100 at admission (he had not been on BP meds).  He was diuresed and BP was controlled.  He was sent home on a regimen of cardiac and BP meds.    I had him do LHC/RHC in 10/16.  This showed normal filling pressures and cardiac output, no significant coronary disease.  Cardiac MRI in 10/16 showed LV EF 38% with normal RV size and RV EF 34%.    He is currently doing better.  BP is mildly elevated today but he ran out of [place] about 2 days ago.  He is doing some landscaping work and has been active, no exertional dyspnea.  No lightheadedness.  No orthopnea/PND.  No chest pain.  Weight down 1 lb.  He is still drinking a 6-pack about once a week. He snores loudly.   Labs (8/16): K 4.9, creatinine 1.72 Labs (9/16): BNP 61, TGs 493, HgbA1c 7.1, TSH normal, K 4.3, creatinine 1.3 Labs (10/16): K 4.3, creatinine 1.4  PMH: 1. HTN: Since at least 2013. 2. Prior ETOH abuse 3. Type II diabetes: Not on meds currently.  4. Low back pain: h/o disc  herniations, s/p back surgery x 2.  5. Stage IIa nodular sclerosing Hodgkin's disease: 4 cycles ABVD (doxorubicin, bleomycin, vinblastine, dacarbazine) in 4/13.  Now in remission.  6. CKD 7. 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  8. Chronic systolic CHF: Nonischemic cardiomyopathy.  8/16 admission to hospital in [place] with acute systolic CHF. Echo showed EF 20%.  He was diuresed and discharged home.  LHC/RHC (10/16) with no significant coronary disease; mean RA 4, PA 29/14, mean PCWP 10, CI 2.21.  Cardiac MRI (10/16) with EF 38%, mild to moderate LVH, normal RV size with RV EF 34%, no late gadolinium enhancement.   SH: Nonsmoker (quit in 2013), lives with girlfriend in [place], out of work Nature conservation officer.  Prior heavy ETOH, has cut back a lot (now drinks a 6 pack in a week).   FH: Grandfather with MI in his 50s and died at 5 from [place].  1st cousin with "99% blockage" in her 105s.  No siblings.   ROS: All systems reviewed and negative except as per HPI.   Current Outpatient Prescriptions  Medication Sig Dispense Refill  . acetaminophen (TYLENOL) 500 MG tablet Take 500 mg by mouth every 6 (six) hours as needed for mild pain.    Marland Kitchen amLODipine (NORVASC) 10 MG tablet Take  1 tablet (10 mg total) by mouth daily. 30 tablet 3  . aspirin 81 MG tablet Take 81 mg by mouth daily.    . carvedilol (COREG) 25 MG tablet Take 25 mg by mouth 2 (two) times daily with a meal.    . clonazePAM (KLONOPIN) 1 MG tablet Take 1 mg by mouth 2 (two) times daily as needed for anxiety.    . furosemide (LASIX) 40 MG tablet Take 1 tablet (40 mg total) by mouth daily. 90 tablet 3  . hydrALAZINE (APRESOLINE) 50 MG tablet Take 50 mg by mouth 3 (three) times daily.    . Hydrocodone-Acetaminophen (VICODIN) 5-300 MG TABS Take 1-2 tablets by mouth every 6 (six) hours as needed (pain).    . isosorbide mononitrate (IMDUR) 60 MG 24 hr tablet Take 1 tablet (60 mg total) by mouth daily. 90 tablet 3  . Multiple  Vitamins-Minerals (MULTIVITAMIN WITH MINERALS) tablet Take 1 tablet by mouth daily.    . Omega-3 Fatty Acids (FISH OIL) 1000 MG CAPS Take 2 capsules (2,000 mg total) by mouth 2 (two) times daily. 60 capsule 0  . sacubitril-valsartan (ENTRESTO) 49-51 MG Take 1 tablet by mouth 2 (two) times daily. 180 tablet 3   No current facility-administered medications for this encounter.   BP 136/90 mmHg  Pulse 74  Wt 236 lb 12.8 oz (107.412 kg)  SpO2 96% General: NAD Neck: Thick, no JVD, no thyromegaly or thyroid nodule.  Lungs: Clear to auscultation bilaterally with normal respiratory effort. CV: Nondisplaced PMI.  Heart regular S1/S2, no S3/S4, no murmur.  No peripheral edema.  No carotid bruit.  Normal pedal pulses.  Abdomen: Soft, nontender, no hepatosplenomegaly, no distention.  Skin: Intact without lesions or rashes.  Neurologic: Alert and oriented x 3.  Psych: Normal affect. Extremities: No clubbing or cyanosis.  HEENT: Normal.   Assessment/Plan: 1. Chronic systolic CHF: Nonischemic cardiomyopathy.  EF 20% by echo in [place], 38% on cardiac MRI in 10/16.  NYHA class I-II symptoms currently.  He is not volume overloaded on exam.  Etiology of cardiomyopathy is uncertain.  He has multiple reasons for fall in EF.  He had chemotherapy including doxorubicin back in 2013.  He has had uncontrolled HTN apparently for at least several years. He drank heavily up to 8/16 admission. No late gadolinium enhancement on cardiac MRI.  - I encouraged him to cut back as much as possible on ETOH => Would avoid the periodic binge-drinking that he still seems to be doing (he has cut back). - Control BP.    - Repeat echo in 2/17 reassess LV function.   - Continue current Coreg.  - Continue hydralazine 50 mg tid/Imdur 60 mg daily. - Increase Entresto to 49/51 bid.  BMET/BNP in 10 days.  - Decrease Lasix to 40 mg daily.  - Followup in 1 month, next step will be addition of spironolactone.   2. Suspected OSA: Will  arrange for sleep study when he has insurance coverage.  3. HTN: BP better controlled.  Increasing Entresto as above. 4. Type II diabetes: Per PCP.  5. Hyperlipidemia: Elevated triglycerides.  He cannot afford fenofibrate.  He is on fish oil 2 g bid. Will repeat lipids in a few months.   Followup in 1 month.  [name] 09/19/2015

## 2015-09-19 NOTE — Addendum Note (Signed)
Encounter addended by: Larey Dresser, MD on: 09/19/2015  2:28 PM<BR>     Documentation filed: Notes Section

## 2015-09-30 ENCOUNTER — Ambulatory Visit (HOSPITAL_COMMUNITY)
Admission: RE | Admit: 2015-09-30 | Discharge: 2015-09-30 | Disposition: A | Discharge status: Home / Self Care | Payer: Self-pay | Source: Ambulatory Visit | Attending: Cardiology | Admitting: Cardiology

## 2015-09-30 DIAGNOSIS — I5022 Chronic systolic (congestive) heart failure: Secondary | ICD-10-CM | POA: Insufficient documentation

## 2015-09-30 LAB — BASIC METABOLIC PANEL
ANION GAP: 8 (ref 5–15)
BUN: 19 mg/dL (ref 6–20)
CALCIUM: 9.8 mg/dL (ref 8.9–10.3)
CO2: 26 mmol/L (ref 22–32)
CREATININE: 1.44 mg/dL — AB (ref 0.61–1.24)
Chloride: 102 mmol/L (ref 101–111)
Glucose, Bld: 143 mg/dL — ABNORMAL HIGH (ref 65–99)
Potassium: 4.1 mmol/L (ref 3.5–5.1)
SODIUM: 136 mmol/L (ref 135–145)

## 2015-10-20 ENCOUNTER — Other Ambulatory Visit (HOSPITAL_COMMUNITY): Payer: Self-pay | Admitting: *Deleted

## 2015-10-20 ENCOUNTER — Inpatient Hospital Stay (HOSPITAL_COMMUNITY): Admission: RE | Admit: 2015-10-20 | Payer: Self-pay | Source: Ambulatory Visit

## 2015-11-03 DIAGNOSIS — Z736 Limitation of activities due to disability: Secondary | ICD-10-CM

## 2015-11-09 ENCOUNTER — Encounter (HOSPITAL_COMMUNITY): Payer: Self-pay

## 2015-11-09 MED FILL — FUROSEMIDE 40 MG TABLET: 40 | 50 days supply | Qty: 100 | Fill #1

## 2015-11-11 MED FILL — clonazePAM 2 MG TABS: 2 | 30 days supply | Qty: 120 | Fill #1

## 2015-11-17 MED FILL — AMLODIPINE BESYLATE 10 MG T: 10 | 30 days supply | Qty: 30 | Fill #3

## 2015-11-23 ENCOUNTER — Emergency Department (HOSPITAL_BASED_OUTPATIENT_CLINIC_OR_DEPARTMENT_OTHER)
Admission: EM | Admit: 2015-11-23 | Discharge: 2015-11-23 | Disposition: A | Discharge status: Home / Self Care | Payer: Self-pay | Attending: Emergency Medicine | Admitting: Emergency Medicine

## 2015-11-23 ENCOUNTER — Encounter (HOSPITAL_BASED_OUTPATIENT_CLINIC_OR_DEPARTMENT_OTHER): Payer: Self-pay | Admitting: Emergency Medicine

## 2015-11-23 DIAGNOSIS — Z79899 Other long term (current) drug therapy: Secondary | ICD-10-CM | POA: Insufficient documentation

## 2015-11-23 DIAGNOSIS — K029 Dental caries, unspecified: Secondary | ICD-10-CM | POA: Insufficient documentation

## 2015-11-23 DIAGNOSIS — Z9889 Other specified postprocedural states: Secondary | ICD-10-CM | POA: Insufficient documentation

## 2015-11-23 DIAGNOSIS — Z880 Allergy status to penicillin: Secondary | ICD-10-CM | POA: Insufficient documentation

## 2015-11-23 DIAGNOSIS — F419 Anxiety disorder, unspecified: Secondary | ICD-10-CM | POA: Insufficient documentation

## 2015-11-23 DIAGNOSIS — Z8701 Personal history of pneumonia (recurrent): Secondary | ICD-10-CM | POA: Insufficient documentation

## 2015-11-23 DIAGNOSIS — G8929 Other chronic pain: Secondary | ICD-10-CM | POA: Insufficient documentation

## 2015-11-23 DIAGNOSIS — R011 Cardiac murmur, unspecified: Secondary | ICD-10-CM | POA: Insufficient documentation

## 2015-11-23 DIAGNOSIS — Z8571 Personal history of Hodgkin lymphoma: Secondary | ICD-10-CM | POA: Insufficient documentation

## 2015-11-23 DIAGNOSIS — Z7982 Long term (current) use of aspirin: Secondary | ICD-10-CM | POA: Insufficient documentation

## 2015-11-23 DIAGNOSIS — I10 Essential (primary) hypertension: Secondary | ICD-10-CM | POA: Insufficient documentation

## 2015-11-23 DIAGNOSIS — K0889 Other specified disorders of teeth and supporting structures: Secondary | ICD-10-CM | POA: Insufficient documentation

## 2015-11-23 DIAGNOSIS — Z87891 Personal history of nicotine dependence: Secondary | ICD-10-CM | POA: Insufficient documentation

## 2015-11-23 DIAGNOSIS — Z8709 Personal history of other diseases of the respiratory system: Secondary | ICD-10-CM | POA: Insufficient documentation

## 2015-11-23 DIAGNOSIS — E119 Type 2 diabetes mellitus without complications: Secondary | ICD-10-CM | POA: Insufficient documentation

## 2015-11-23 HISTORY — DX: Alcohol abuse, in remission: F10.11

## 2015-11-23 MED ORDER — IBUPROFEN 600 MG PO TABS: 600.0000 mg | ORAL_TABLET | Freq: Four times a day (QID) | ORAL | Status: DC | PRN

## 2015-11-23 MED ORDER — CLINDAMYCIN HCL 150 MG PO CAPS
450.0000 mg | ORAL_CAPSULE | Freq: Three times a day (TID) | ORAL | Status: DC
Start: 2015-11-23 — End: 2016-03-21

## 2015-11-23 MED ORDER — BUPIVACAINE-EPINEPHRINE (PF) 0.5% -1:200000 IJ SOLN
1.8000 mL | Freq: Once | INTRAMUSCULAR | Status: AC
  Administered 2015-11-23: 1.8 mL
  Filled 2015-11-23: qty 1.8

## 2015-11-23 MED FILL — CLINDAMYCIN HCL 150 MG CAP: 150 | 7 days supply | Qty: 63 | Fill #0

## 2015-11-23 NOTE — ED Notes (Signed)
Right lower dental pain since Friday.  Pt has dental caries and broken teeth in area.  Gums red and irritated.  Pt states pain radiating to jaw.

## 2015-11-23 NOTE — ED Provider Notes (Signed)
CSN: [id number]     Arrival date & time 11/23/15  0900 History   First MD Initiated Contact with Patient 11/23/15 [phone]     Chief Complaint  Patient presents with  . Dental Pain     (Consider location/radiation/quality/duration/timing/severity/associated sxs/prior Treatment) HPI Kevin Reese is a 35 y.o. male who comes in for evaluation of dental pain. Patient reports right lower jaw dental pain onset Friday, gradually worsening. Worse with eating foods and temperature changes. Patient does not have a dentist and has not followed up for this problem. He is tried OTC dental pain medications without relief. Pain is moderate in the ED. No fevers, chills, difficulties opening jaw, difficulty swallowing, sore throat. No other modifying factors.  Past Medical History  Diagnosis Date  . Mediastinal mass   . Hypertension     was on Azor and Amlodipine/Benazepril;was started on these in 2009;came off of these meds a yr ago bc can't afford them  . Heart murmur     diagnosed in high school  . Chronic back pain     hx buldging disc  . Anxiety     takes Klonopin daily  . 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 Cancer ([place])   . Pneumonia   . Diabetes mellitus     Pt has tested WNL since wt loss.  Marland Kitchen History of ETOH abuse    Past Surgical History  Procedure Laterality Date  . Back surgery  2009/2011  . Mediastinal mass excision  10/2011  . Cardiac catheterization N/A 08/05/2015    Procedure: Right/Left Heart Cath and Coronary Angiography;  Surgeon: Larey Dresser, MD;  Location: [facility];  Service: Cardiovascular;  Laterality: N/A;   Family History  Problem Relation Age of Onset  . Anesthesia problems Neg Hx   . Hypotension Neg Hx   . Malignant hyperthermia Neg Hx   . Pseudochol deficiency Neg Hx    Social History  Substance Use Topics  . Smoking status: Former Smoker -- 0.50 packs/day for 14 years    Types: Cigarettes    Start date: 10/27/1999     Quit date: 09/26/2013  . Smokeless tobacco: Never Used     Comment: quit 6 months ago  . Alcohol Use: 3.6 oz/week    6 Cans of beer per week    Review of Systems A 10 point review of systems was completed and was negative except for pertinent positives and negatives as mentioned in the history of present illness     Allergies  Penicillins  Home Medications   Prior to Admission medications   Medication Sig Start Date End Date Taking? Authorizing Provider  acetaminophen (TYLENOL) 500 MG tablet Take 500 mg by mouth every 6 (six) hours as needed for mild pain.   Yes Historical Provider, MD  amLODipine (NORVASC) 10 MG tablet Take 1 tablet (10 mg total) by mouth daily. 08/17/15  Yes Larey Dresser, MD  aspirin 81 MG tablet Take 81 mg by mouth daily.   Yes Historical Provider, MD  clonazePAM (KLONOPIN) 1 MG tablet Take 1 mg by mouth 2 (two) times daily as needed for anxiety.   Yes Historical Provider, MD  furosemide (LASIX) 40 MG tablet Take 1 tablet (40 mg total) by mouth daily. 09/18/15  Yes Larey Dresser, MD  isosorbide mononitrate (IMDUR) 60 MG 24 hr tablet Take 1 tablet (60 mg total) by mouth daily. 07/27/15  Yes Larey Dresser, MD  Multiple Vitamins-Minerals (MULTIVITAMIN WITH  MINERALS) tablet Take 1 tablet by mouth daily.   Yes Historical Provider, MD  Omega-3 Fatty Acids (FISH OIL) 1000 MG CAPS Take 2 capsules (2,000 mg total) by mouth 2 (two) times daily. 08/17/15  Yes Larey Dresser, MD  sacubitril-valsartan (ENTRESTO) 49-51 MG Take 1 tablet by mouth 2 (two) times daily. 09/18/15  Yes Larey Dresser, MD  clindamycin (CLEOCIN) 150 MG capsule Take 3 capsules (450 mg total) by mouth 3 (three) times daily. X 7 days 11/23/15   Comer Locket, PA-C  ibuprofen (ADVIL,MOTRIN) 600 MG tablet Take 1 tablet (600 mg total) by mouth every 6 (six) hours as needed. 11/23/15   Rosaland Shiffman, PA-C   BP 145/96 mmHg  Pulse 92  Temp(Src) 98 F (36.7 C) (Oral)  Resp 18  Ht 5\' 11"  (1.803  m)  Wt 103.874 kg  BMI 31.95 kg/m2  SpO2 98% Physical Exam  Constitutional:  Awake, alert, nontoxic appearance.  HENT:  Head: Atraumatic.  Discomfort located to right mandibular premolars. Overall poor dentition. Multiple eroded teeth with active caries. Mucous membranes are moist. No unilateral tonsillar swelling, uvula midline, no glossal swelling or elevation. No trismus. No fluctuance or evidence of a drainable abscess. No other evidence of emergent infection, Retropharyngeal or Peritonsillar abscess, Ludwig or Vincents angina. Tolerating secretions well. Patent airway   Eyes: Right eye exhibits no discharge. Left eye exhibits no discharge.  Neck: Neck supple.  Cardiovascular: Normal rate, regular rhythm and normal heart sounds.   Pulmonary/Chest: Effort normal. He exhibits no tenderness.  Musculoskeletal: He exhibits no tenderness.  Baseline ROM, no obvious new focal weakness.  Neurological:  Mental status and motor strength appears baseline for patient and situation.  Skin: No rash noted.  Psychiatric: He has a normal mood and affect.  Nursing note and vitals reviewed.   ED Course  Procedures (including critical care time) Labs Review Labs Reviewed - No data to display  Imaging Review No results found. I have personally reviewed and evaluated these images and lab results as part of my medical decision-making.   EKG Interpretation None     NERVE BLOCK Performed by: Verl Dicker Consent: Verbal consent obtained. Required items: required blood products, implants, devices, and special equipment available Time out: Immediately prior to procedure a "time out" was called to verify the correct patient, procedure, equipment, support staff and site/side marked as required.  Indication: Dental pain  Nerve block body site: Right inferior alveolar   Preparation: Patient was prepped and draped in the usual sterile fashion. Needle gauge: 24 G Location technique:  anatomical landmarks  Local anesthetic: Bupivacaine   Anesthetic total: 1.8 ml  Outcome: pain improved Patient tolerance: Patient tolerated the procedure well with no immediate complications.  Meds given in ED:  Medications  bupivacaine-epinephrine (MARCAINE W/ EPI) 0.5% -1:200000 injection 1.8 mL (1.8 mLs Infiltration Given by Other 11/23/15 0955)    New Prescriptions   CLINDAMYCIN (CLEOCIN) 150 MG CAPSULE    Take 3 capsules (450 mg total) by mouth 3 (three) times daily. X 7 days   IBUPROFEN (ADVIL,MOTRIN) 600 MG TABLET    Take 1 tablet (600 mg total) by mouth every 6 (six) hours as needed.   Filed Vitals:   11/23/15 0902  BP: 145/96  Pulse: 92  Temp: 98 F (36.7 C)  TempSrc: Oral  Resp: 18  Height: 5\' 11"  (1.803 m)  Weight: 103.874 kg  SpO2: 98%     MDM  Here for evaluation of dental pain. On exam, there  is no evidence of a drainable abscess. No trismus, glossal elevation, unilateral tonsillar swelling. No evidence of retropharyngeal or peritonsillar abscess or Ludwig angina. Patient received a dental block in the ED and experiences relief. Discharged with outpatient dental resources as well as referral to on-call dentistry. Also given prescription for anti-inflammatories and antibiotics. Reports allergy to penicillin, will DC with clindamycin. Overall, appears well, nontoxic and appropriate for discharge.  Final diagnoses:  Pain, dental        Comer Locket, PA-C 11/23/15 [place], DO 11/23/15 1239

## 2015-11-23 NOTE — ED Notes (Signed)
PA at bedside.

## 2015-11-23 NOTE — Discharge Instructions (Signed)
Please take your antibiotics as prescribed. Do not save or share this medication. Follow-up with dentistry for definitive care. Continue taking Motrin for your discomfort.  [place]  [address]  [place], [place] [address]  Phone [phone]  The [place] in [place], [place], exemplifies the Health Net vision to improve the health and quality of life of all [place] by Regulatory affairs officer with a passion to care for the underserved and by leading the nation in community-based, service learning oral health education. We are committed to offering comprehensive general dental services for adults, children and special needs patients in a safe, caring and professional setting.  Appointments: Our clinic is open Monday through Friday 8:00 a.m. until 5:00 p.m. The amount of time scheduled for an appointment depends on the patients specific needs. We ask that you keep your appointed time for care or provide 24-hour notice of all appointment changes. Parents or legal guardians must accompany minor children.  Payment for Services: Medicaid and other insurance plans are welcome. Payment for services is due when services are rendered and may be made by cash or credit card. If you have dental insurance, we will assist you with your claim submission.   Emergencies: Emergency services will be provided Monday through Friday on a walk-in basis. Please arrive early for emergency services. After hours emergency services will be provided for patients of record as required.  Services:  Comprehensive General Dentistry  Childrens Dentistry  Oral Surgery - Extractions  Root Canals  Sealants and Tooth Colored Fillings  Crowns and Bridges  Dentures and Partial Dentures  Implant Services  Periodontal Services and Proofreader  3-D/Cone Beam Imaging   Emergency Department Resource Guide 1) Find a Doctor and Pay Out of Pocket Although you won't have to find out who is covered by your insurance plan, it is a good idea to ask around and get recommendations. You will then need to call the office and see if the doctor you have chosen will accept you as a new patient and what types of options they offer for patients who are self-pay. Some doctors offer discounts or will set up payment plans for their patients who do not have insurance, but you will need to ask so you aren't surprised when you get to your appointment.  2) Contact Your Local Health Department Not all health departments have doctors that can see patients for sick visits, but many do, so it is worth a call to see if yours does. If you don't know where your local health department is, you can check in your phone book. The CDC also has a tool to help you locate your state's health department, and many state websites also have listings of all of their local health departments.  3) Find a [facility] If your illness is not likely to be very severe or complicated, you may want to try a walk in clinic. These are popping up all over the country in pharmacies, drugstores, and shopping centers. They're usually staffed by nurse practitioners or physician assistants that have been trained to treat common illnesses and complaints. They're usually fairly quick and inexpensive. However, if you have serious medical issues or chronic medical problems, these are probably not your best option.  No Primary Care Doctor: - Call Health Connect at  [phone] - they  can help you locate a primary care doctor that  accepts your insurance, provides certain services, etc. - Physician Referral Service- [phone]  Chronic Pain Problems: Organization         Address  Phone   Notes  [facility]  [phone] Patients  need to be referred by their primary care doctor.   Medication Assistance: Organization         Address  Phone   Notes  [facility] Medication [facility] [address] [place], [place] [address] [phone] --Must be a resident of [facility] -- Must have NO insurance coverage whatsoever (no Medicaid/ Medicare, etc.) -- The pt. MUST have a primary care doctor that directs their care regularly and follows them in the community   MedAssist  [phone]   [employer]  [phone]    Agencies that provide inexpensive medical care: Organization         Address  Phone   Notes  [place]  [phone]   [facility]    [phone]   [facility] [place], [place] [address] [phone]   [place] [address] [place] [phone]   Planned Parenthood    [phone]   [facility]    [phone]   [place] and [facility] [address], [place] Phone:  [phone], Fax:  [phone] Hours of Operation:  9 am - 6 pm, M-F.  Also accepts Medicaid/Medicare and self-pay.  [facility] for [place], [address], [place] Phone: [phone], Fax: [phone]. Hours of Operation:  8:30 am - 5:30 pm, M-F.  Also accepts Medicaid and self-pay.  [facility] [place] [address] [place] Phone: [phone]   [place], [place], [place] [phone], Ext. [phone] Mondays & Thursdays: 7-9 AM.  First 15 patients are seen on a first come, first serve basis.    [place] Providers:  Organization         Address  Phone   Notes  [facility] [address], [place] [phone] Also accepts self-pay patients.  [facility] [id number] [place], [place]  [phone]     [place], [address], [place] [phone]   [facility] [address], [place] [phone]   Lucianne Lei [address], [place]   [phone] Only accepts [place] Access [place] patients after they have their name applied to their card.   Self-Pay (no insurance) in [facility]:  Organization         Address  Phone   Notes  Sickle Cell Patients, [facility] Internal Medicine [place] [phone]   [facility] Urgent Care [place] [phone]   [facility] [place]  [place], [place], [place] [phone]   [facility]/Dr. Osei-Bonsu  [address], [place] or [address], [place] [phone] Phone number for both [place] and [place] locations is the same.  Urgent Medical and [facility] [address], [place] [phone]   [facility] [place] or [place]  [facility] [address] [phone]   [facility] [place] [phone], phone; [phone], fax Sees patients 1st and 3rd Saturday of every month.  Must not qualify for public or private insurance (i.e. Medicaid, Medicare, [place] Health Choice, Veterans' Benefits)  Household income should be no more than 200% of the poverty level The clinic cannot treat you if you are pregnant or think you are pregnant  Sexually transmitted diseases are not treated at the clinic.    Dental Care: Organization         Address  Phone  Notes  [facility] Department of [facility] [place] [phone] Accepts children up to age 75 who are enrolled in [place] or [place]; pregnant women with a Medicaid card; and children who have applied for Medicaid or [place] Health Choice, but were declined,  whose parents can pay a reduced fee at time of service.  [facility] Department of [facility]  [address], [place] [phone] Accepts children up to age 56 who are enrolled in [place] or [place]; pregnant women with a Medicaid card; and children who have applied for Medicaid or [place] Health Choice, but were declined, whose parents can pay a reduced fee at time of service.  [place] Adult Dental Access PROGRAM  [place] [phone] Patients are seen by appointment only. Walk-ins are not accepted. [place] will see patients 45 years of age and older. Monday - Tuesday (8am-5pm) Most Wednesdays (8:30-5pm) $30 per visit, cash only  [facility] Adult Dental Access PROGRAM  [address], [facility] [phone] Patients are seen by appointment only. Walk-ins are not accepted. [place] will see patients 61 years of age and older. One Wednesday Evening (Monthly: Volunteer Based).  $30 per visit, cash only  [place]  [phone] for adults; Children under age 74, call Graduate Pediatric Dentistry at [phone]. Children aged 72-14, please call [phone] to request a pediatric application.  Dental services are provided in all areas of dental care including fillings, crowns and bridges, complete and partial dentures, implants, gum treatment, root canals, and extractions. Preventive care is also provided. Treatment is provided to both adults and children. Patients are selected via a lottery and there is often a waiting list.   [facility] [address], [place]  [phone] [email or web address]   [facility] [address] [place], [place] [phone], Ext. [phone] Second and Fourth Thursday of each month, opens at 6:30 AM; Clinic ends at 9 AM.  Patients are seen on a first-come first-served basis, and a limited number are seen during each clinic.   [facility]  [address] Hillard Danker [place], [place] [phone]   Eligibility Requirements You must have lived in [place], [place], or [place] counties for at least the last three months.   You cannot be eligible for state or federal sponsored [employer], including [employer], [place], or [employer].   You generally cannot be eligible for healthcare insurance through your employer.    How to apply: Eligibility screenings are held every Tuesday and Wednesday afternoon from 1:00 pm until 4:00 pm. You do not need an appointment for the interview!  [facility] [address] [place], [place]   [facility]  [place]  Health Department  [place]  [phone]    Behavioral Health Resources in the Community: Intensive Outpatient Programs Organization         Address  Phone  Notes  [place]. [address], [place], [place] [phone]   [facility] [address] [place], [place]   ADS: Alcohol & Drug Svcs [address] [place], [place]   [place] [address]  [place], [place] or [phone]   Substance Abuse Resources Organization         Address  Phone  Notes  Alcohol and Drug Services  [phone]   [place]  [phone]   [facility] [place]   Chinita Pester  [phone]   Residential & Outpatient Substance Abuse Program  [phone]   Psychological Services Organization         Address  Phone  Notes  [facility] [place]  [place]  [phone]   [place] [address], [place] or [phone]    Mobile Crisis Teams Organization         Address  Phone  Notes  Therapeutic Alternatives, Mobile Crisis Care Unit   [phone]   Assertive Psychotherapeutic Services  [address] [place], [place]   [place] Levels [address] [place] [phone]    Self-Help/Support Groups Organization         Address  Phone             Notes  [place]. of [place] - variety of support groups  [place] Call for more information  Narcotics Anonymous (NA), [facility]aring [facility] [address], [employer] [place]  2 meetings at this location   Special educational needs teacher         Address  Phone  Notes  ASAP Residential Treatment [place],    [place]  [phone]   [facility]  [address] [place] [id number], [place], [place]   [place], [place] [phone] Admissions: 8am-3pm M-F  Incentives Substance Magazine [address]    [place], [place] [phone]   [facility] [address] [place], [place], [place]   [facility] [address]  [place], [place]   Insight Programs - Intensive Outpatient [address], [place], [place]   [facility] ([place].) [place].,  [place], [place] [phone] or [phone]   Residential Treatment Services (RTS) [address], [place], [place] Accepts Medicaid  [facility] [place] [address],  [place] [phone] Substance Abuse/Addiction Treatment   [facility] Organization         Address  Phone  Notes  [facility]  [phone]   Domenic Schwab, PhD [address] Arlis [place], [place]   [phone] or [phone]   [place], [place] [phone]   [place] [address] [place], [place] [phone] Insurance/Medicaid/sponsorship through [employer] and Families [address], [id number]  [place], [place] [phone] [place], [place] [phone]    Dr. Adele Schilder  [phone]   [facility] Dept. 1) [address] [place] 2) [place] 3)  [place], [place] [phone]  [phone]   [place] [phone] or [phone] (After Hours)

## 2015-11-26 ENCOUNTER — Telehealth: Payer: Self-pay | Admitting: Hematology & Oncology

## 2015-11-26 NOTE — Telephone Encounter (Signed)
Pt in ofc today to sign AUTHORIZATION FOR USE/DISCLOSURE OF PROTECTED HEALTH INFORMATION (Release of Information Form) for his medical records.          COPY SCANNED

## 2015-12-04 ENCOUNTER — Ambulatory Visit (HOSPITAL_COMMUNITY)
Admission: RE | Admit: 2015-12-04 | Discharge: 2015-12-04 | Disposition: A | Discharge status: Home / Self Care | Payer: Self-pay | Source: Ambulatory Visit | Attending: Cardiology | Admitting: Cardiology

## 2015-12-04 VITALS — BP 114/71 | HR 66 | Ht 71.0 in | Wt 237.4 lb

## 2015-12-04 DIAGNOSIS — I10 Essential (primary) hypertension: Secondary | ICD-10-CM

## 2015-12-04 DIAGNOSIS — Z7982 Long term (current) use of aspirin: Secondary | ICD-10-CM | POA: Insufficient documentation

## 2015-12-04 DIAGNOSIS — Z79899 Other long term (current) drug therapy: Secondary | ICD-10-CM | POA: Insufficient documentation

## 2015-12-04 DIAGNOSIS — I129 Hypertensive chronic kidney disease with stage 1 through stage 4 chronic kidney disease, or unspecified chronic kidney disease: Secondary | ICD-10-CM | POA: Insufficient documentation

## 2015-12-04 DIAGNOSIS — I429 Cardiomyopathy, unspecified: Secondary | ICD-10-CM | POA: Insufficient documentation

## 2015-12-04 DIAGNOSIS — I5022 Chronic systolic (congestive) heart failure: Secondary | ICD-10-CM | POA: Insufficient documentation

## 2015-12-04 DIAGNOSIS — G629 Polyneuropathy, unspecified: Secondary | ICD-10-CM | POA: Insufficient documentation

## 2015-12-04 DIAGNOSIS — N189 Chronic kidney disease, unspecified: Secondary | ICD-10-CM | POA: Insufficient documentation

## 2015-12-04 DIAGNOSIS — E781 Pure hyperglyceridemia: Secondary | ICD-10-CM | POA: Insufficient documentation

## 2015-12-04 DIAGNOSIS — E1122 Type 2 diabetes mellitus with diabetic chronic kidney disease: Secondary | ICD-10-CM | POA: Insufficient documentation

## 2015-12-04 LAB — BASIC METABOLIC PANEL
ANION GAP: 7 (ref 5–15)
BUN: 19 mg/dL (ref 6–20)
CALCIUM: 10.2 mg/dL (ref 8.9–10.3)
CO2: 25 mmol/L (ref 22–32)
Chloride: 106 mmol/L (ref 101–111)
Creatinine, Ser: 1.24 mg/dL (ref 0.61–1.24)
GLUCOSE: 125 mg/dL — AB (ref 65–99)
POTASSIUM: 4.3 mmol/L (ref 3.5–5.1)
Sodium: 138 mmol/L (ref 135–145)

## 2015-12-04 LAB — LIPID PANEL
CHOL/HDL RATIO: 4.8 ratio
Cholesterol: 167 mg/dL (ref 0–200)
HDL: 35 mg/dL — AB (ref >40)
LDL Cholesterol: 92 mg/dL (ref 0–99)
TRIGLYCERIDES: 201 mg/dL — AB (ref <150)
VLDL: 40 mg/dL (ref 0–40)

## 2015-12-04 MED ORDER — CARVEDILOL 25 MG PO TABS: 12.5000 mg | ORAL_TABLET | Freq: Two times a day (BID) | ORAL | Status: DC

## 2015-12-04 NOTE — Patient Instructions (Signed)
Take Carvedilol 12.5 mg (1/2 tab) Twice daily   Labs today  Your physician recommends that you schedule a follow-up appointment in: 2 months with echocardiogram

## 2015-12-04 NOTE — Progress Notes (Signed)
Patient ID: Kevin Reese, male   DOB: 1980/11/06, 35 y.o.   MRN: [id number] Oncologist: Dr. Marin Olp Cardiologist: Dr. Aundra Dubin PCP: Dr. Arcelia Jew  35 yo with history of HTN, diabetes, Hodgkins disease, CKD, and chronic systolic CHF presents for CHF clinic evaluation.  Patient has had HTN since at least 2013.  It has only been sporadically treated.  He has a diagnosis of diabetes but is not on meds. He was treated for Hodgkins disease in 2013 with ABVD which contains doxorubicin.  He was treated in [place], and does not appear to have had a prior echo done here. He was a heavy drinker in the past, he has cut this back to about a 6-pack a week at most.    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  Finally, he was admitted in 8/16 to a hospital in [place] (had been up there for work).  EF was found to be 20% and he had pulmonary edema.  BP was > 200/100 at admission (he had not been on BP meds).  He was diuresed and BP was controlled.  He was sent home on a regimen of cardiac and BP meds.    I had him do LHC/RHC in 10/16.  This showed normal filling pressures and cardiac output, no significant coronary disease.  Cardiac MRI in 10/16 showed LV EF 38% with normal RV size and RV EF 34%.    He is doing quite well now.  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  No exertional dyspnea.  No orthopnea/PND. No chest pain.  Really no limitations at this point. He has stopped hydralazine/Imdur. He is also not taking Coreg (not sure why he is off this).  He continues on Copper Dever. Weight stable.   Labs (8/16): K 4.9, creatinine 1.72 Labs (9/16): BNP 61, TGs 493, HgbA1c 7.1, TSH normal, K 4.3, creatinine 1.3 Labs (10/16): K 4.3, creatinine 1.4 Labs (11/16): K 4.1, creatinine 1.44  PMH: 1. HTN: Since at least 2013. 2. Prior ETOH abuse 3. Type II diabetes: Not on meds currently.  4. Low back pain: h/o  disc herniations, s/p back surgery x 2.  5. Stage IIa nodular sclerosing Hodgkin's disease: 4 cycles ABVD (doxorubicin, bleomycin, vinblastine, dacarbazine) in 4/13.  Now in remission.  6. CKD 7. 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  8. Chronic systolic CHF: Nonischemic cardiomyopathy.  8/16 admission to hospital in [place] with acute systolic CHF. Echo showed EF 20%.  He was diuresed and discharged home.  LHC/RHC (10/16) with no significant coronary disease; mean RA 4, PA 29/14, mean PCWP 10, CI 2.21.  Cardiac MRI (10/16) with EF 38%, mild to moderate LVH, normal RV size with RV EF 34%, no late gadolinium enhancement.   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  Prior heavy ETOH, has cut back a lot (now drinks a 6 pack in a week).   FH: Grandfather with MI in his 13s and died at 24 from [place].  1st cousin with "99% blockage" in her 32s.  No siblings.   ROS: All systems reviewed and negative except as per HPI.   Current Outpatient Prescriptions  Medication Sig Dispense Refill  . acetaminophen (TYLENOL) 500 MG tablet Take 500 mg by mouth every 6 (six) hours as needed for mild pain.    Marland Kitchen amLODipine (NORVASC) 10 MG tablet Take 1 tablet (10 mg total) by  mouth daily. 30 tablet 3  . aspirin 81 MG tablet Take 81 mg by mouth daily.    . clindamycin (CLEOCIN) 150 MG capsule Take 3 capsules (450 mg total) by mouth 3 (three) times daily. X 7 days 22 capsule 0  . clonazePAM (KLONOPIN) 1 MG tablet Take 2 mg by mouth 4 (four) times daily as needed for anxiety.     . furosemide (LASIX) 40 MG tablet Take 1 tablet (40 mg total) by mouth daily. 90 tablet 3  . ibuprofen (ADVIL,MOTRIN) 600 MG tablet Take 1 tablet (600 mg total) by mouth every 6 (six) hours as needed. 30 tablet 0  . Multiple Vitamins-Minerals (MULTIVITAMIN WITH MINERALS) tablet Take 1 tablet by mouth daily.    . Omega-3 Fatty Acids (FISH OIL) 1000 MG CAPS Take 2 capsules (2,000 mg  total) by mouth 2 (two) times daily. 60 capsule 0  . sacubitril-valsartan (ENTRESTO) 49-51 MG Take 1 tablet by mouth 2 (two) times daily. 180 tablet 3  . carvedilol (COREG) 25 MG tablet Take 0.5 tablets (12.5 mg total) by mouth 2 (two) times daily. 180 tablet 3  . isosorbide mononitrate (IMDUR) 60 MG 24 hr tablet Take 1 tablet (60 mg total) by mouth daily. (Patient not taking: Reported on 12/04/2015) 90 tablet 3   No current facility-administered medications for this encounter.   BP 114/71 mmHg  Pulse 66  Ht 5\' 11"  (1.803 m)  Wt 237 lb 6.4 oz (107.684 kg)  BMI 33.13 kg/m2  SpO2 98% General: NAD Neck: Thick, no JVD, no thyromegaly or thyroid nodule.  Lungs: Clear to auscultation bilaterally with normal respiratory effort. CV: Nondisplaced PMI.  Heart regular S1/S2, no S3/S4, no murmur.  No peripheral edema.  No carotid bruit.  Normal pedal pulses.  Abdomen: Soft, nontender, no hepatosplenomegaly, no distention.  Skin: Intact without lesions or rashes.  Neurologic: Alert and oriented x 3.  Psych: Normal affect. Extremities: No clubbing or cyanosis.  HEENT: Normal.   Assessment/Plan: 1. Chronic systolic CHF: Nonischemic cardiomyopathy.  EF 20% by echo in [place], 38% on cardiac MRI in 10/16.  NYHA class I symptoms currently.  He is not volume overloaded on exam.  Etiology of cardiomyopathy is uncertain.  He has multiple reasons for fall in EF.  He had chemotherapy including doxorubicin back in 2013.  He has had uncontrolled HTN apparently for at least several years. He drank heavily up to 8/16 admission. No late gadolinium enhancement on cardiac MRI.  - I encouraged him to continue to cut back on ETOH as much as possible. - Restart Coreg at 12.5 mg bid (was on 25 mg bid).   - Continue Entresto 49/51 bid.   - Continue Lasix 40 mg dialy, BMET today.  - Followup in 2 months with echo.   2. HTN: BP better controlled.  As above, restarting Coreg.  3. Type II diabetes: Per PCP.  4.  Hyperlipidemia: Elevated triglycerides.  Repeat lipids now that he is on fish oil.   Followup in 2 months.  [name] 12/04/2015

## 2015-12-11 ENCOUNTER — Other Ambulatory Visit (HOSPITAL_COMMUNITY): Payer: Self-pay | Admitting: Cardiology

## 2015-12-11 MED FILL — clonazePAM 2 MG TABS: 2 | 30 days supply | Qty: 120 | Fill #2

## 2015-12-17 ENCOUNTER — Telehealth (HOSPITAL_COMMUNITY): Payer: Self-pay | Admitting: Vascular Surgery

## 2015-12-17 NOTE — Telephone Encounter (Signed)
Called pt to make f/u appt w/ echo pt voicemail is full will send pt a letter to make f/u appt

## 2015-12-23 MED FILL — AMLODIPINE BESYLATE 10 MG T: 10 | 30 days supply | Qty: 30 | Fill #0

## 2016-01-14 ENCOUNTER — Other Ambulatory Visit (HOSPITAL_COMMUNITY): Payer: Self-pay | Admitting: *Deleted

## 2016-01-14 MED ORDER — CARVEDILOL 25 MG PO TABS
12.5000 mg | ORAL_TABLET | Freq: Two times a day (BID) | ORAL | Status: DC
Start: 2016-01-14 — End: 2017-01-16

## 2016-01-14 MED FILL — clonazePAM 2 MG TABS: 2 | 30 days supply | Qty: 120 | Fill #3

## 2016-01-14 MED FILL — CARVEDILOL 25 MG TABLET: 25 | 90 days supply | Qty: 90 | Fill #0

## 2016-01-27 MED FILL — AMLODIPINE BESYLATE 10 MG T: 10 | 30 days supply | Qty: 30 | Fill #1

## 2016-01-28 ENCOUNTER — Telehealth (HOSPITAL_COMMUNITY): Payer: Self-pay | Admitting: Vascular Surgery

## 2016-01-28 NOTE — Telephone Encounter (Signed)
Pt voicemail is full, pt wants to reschedule echo and r/u w/ MD ON 02/05/16

## 2016-02-05 ENCOUNTER — Ambulatory Visit (HOSPITAL_COMMUNITY): Payer: Self-pay

## 2016-02-05 ENCOUNTER — Encounter (HOSPITAL_COMMUNITY): Payer: Self-pay

## 2016-02-29 ENCOUNTER — Ambulatory Visit: Payer: Self-pay | Admitting: Hematology & Oncology

## 2016-02-29 ENCOUNTER — Other Ambulatory Visit: Payer: Self-pay

## 2016-03-03 MED FILL — clonazePAM 2 MG TABS: 2 | 20 days supply | Qty: 80 | Fill #0

## 2016-03-03 MED FILL — FUROSEMIDE 40 MG TABLET: 40 | 50 days supply | Qty: 100 | Fill #2

## 2016-03-03 MED FILL — AMLODIPINE BESYLATE 10 MG T: 10 | 30 days supply | Qty: 30 | Fill #2

## 2016-03-21 ENCOUNTER — Encounter (HOSPITAL_COMMUNITY): Payer: Self-pay

## 2016-03-21 ENCOUNTER — Ambulatory Visit (HOSPITAL_BASED_OUTPATIENT_CLINIC_OR_DEPARTMENT_OTHER)
Admission: RE | Admit: 2016-03-21 | Discharge: 2016-03-21 | Disposition: A | Discharge status: Home / Self Care | Payer: Self-pay | Source: Ambulatory Visit | Attending: Cardiology | Admitting: Cardiology

## 2016-03-21 ENCOUNTER — Ambulatory Visit (HOSPITAL_COMMUNITY)
Admission: RE | Admit: 2016-03-21 | Discharge: 2016-03-21 | Disposition: A | Discharge status: Home / Self Care | Payer: Self-pay | Source: Ambulatory Visit | Attending: Cardiology | Admitting: Cardiology

## 2016-03-21 VITALS — BP 130/84 | HR 60 | Wt 220.5 lb

## 2016-03-21 DIAGNOSIS — N189 Chronic kidney disease, unspecified: Secondary | ICD-10-CM | POA: Insufficient documentation

## 2016-03-21 DIAGNOSIS — Z79899 Other long term (current) drug therapy: Secondary | ICD-10-CM | POA: Insufficient documentation

## 2016-03-21 DIAGNOSIS — I428 Other cardiomyopathies: Secondary | ICD-10-CM | POA: Insufficient documentation

## 2016-03-21 DIAGNOSIS — I5022 Chronic systolic (congestive) heart failure: Secondary | ICD-10-CM | POA: Insufficient documentation

## 2016-03-21 DIAGNOSIS — I10 Essential (primary) hypertension: Secondary | ICD-10-CM

## 2016-03-21 DIAGNOSIS — E785 Hyperlipidemia, unspecified: Secondary | ICD-10-CM | POA: Insufficient documentation

## 2016-03-21 DIAGNOSIS — I130 Hypertensive heart and chronic kidney disease with heart failure and stage 1 through stage 4 chronic kidney disease, or unspecified chronic kidney disease: Secondary | ICD-10-CM | POA: Insufficient documentation

## 2016-03-21 DIAGNOSIS — Z9221 Personal history of antineoplastic chemotherapy: Secondary | ICD-10-CM | POA: Insufficient documentation

## 2016-03-21 DIAGNOSIS — C8110 Nodular sclerosis classical Hodgkin lymphoma, unspecified site: Secondary | ICD-10-CM | POA: Insufficient documentation

## 2016-03-21 DIAGNOSIS — E1122 Type 2 diabetes mellitus with diabetic chronic kidney disease: Secondary | ICD-10-CM | POA: Insufficient documentation

## 2016-03-21 DIAGNOSIS — Z7982 Long term (current) use of aspirin: Secondary | ICD-10-CM | POA: Insufficient documentation

## 2016-03-21 DIAGNOSIS — E781 Pure hyperglyceridemia: Secondary | ICD-10-CM | POA: Insufficient documentation

## 2016-03-21 DIAGNOSIS — E1142 Type 2 diabetes mellitus with diabetic polyneuropathy: Secondary | ICD-10-CM | POA: Insufficient documentation

## 2016-03-21 DIAGNOSIS — Z87891 Personal history of nicotine dependence: Secondary | ICD-10-CM | POA: Insufficient documentation

## 2016-03-21 LAB — BASIC METABOLIC PANEL
ANION GAP: 9 (ref 5–15)
BUN: 12 mg/dL (ref 6–20)
CHLORIDE: 105 mmol/L (ref 101–111)
CO2: 24 mmol/L (ref 22–32)
CREATININE: 1.28 mg/dL — AB (ref 0.61–1.24)
Calcium: 9.6 mg/dL (ref 8.9–10.3)
GFR calc non Af Amer: >60 mL/min (ref >60)
Glucose, Bld: 113 mg/dL — ABNORMAL HIGH (ref 65–99)
Potassium: 4.4 mmol/L (ref 3.5–5.1)
SODIUM: 138 mmol/L (ref 135–145)

## 2016-03-21 LAB — BRAIN NATRIURETIC PEPTIDE: B NATRIURETIC PEPTIDE 5: 15.3 pg/mL (ref 0.0–100.0)

## 2016-03-21 NOTE — Progress Notes (Signed)
Patient ID: Kevin Reese, male   DOB: Jul 14, 1981, 35 y.o.   MRN: [id number] Oncologist: Dr. Marin Olp Cardiologist: Dr. Aundra Dubin PCP: Dr. Arcelia Jew  35 yo with history of HTN, diabetes, Hodgkins disease, CKD, and chronic systolic CHF presents for CHF clinic evaluation.  Patient has had HTN since at least 2013.  It has only been sporadically treated.  He has a diagnosis of diabetes but is not on meds. He was treated for Hodgkins disease in 2013 with ABVD which contains doxorubicin.  He was treated in [place], and does not appear to have had a prior echo done here. He was a heavy drinker in the past, he has cut this back to about a 6-pack a week at most.    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  Finally, he was admitted in 8/16 to a hospital in [place] (had been up there for work).  EF was found to be 20% and he had pulmonary edema.  BP was > 200/100 at admission (he had not been on BP meds).  He was diuresed and BP was controlled.  He was sent home on a regimen of cardiac and BP meds.    I had him do LHC/RHC in 10/16.  This showed normal filling pressures and cardiac output, no significant coronary disease.  Cardiac MRI in 10/16 showed LV EF 38% with normal RV size and RV EF 34%.  Repeat echo in 5/17 with ongoing medical management showed EF improved to 50-55%.    He is doing quite well now.  Back at work landscaping full time.  No exertional dyspnea.  No orthopnea/PND. No chest pain.  Really no limitations at this point. He remains on Entresto and Coreg.  Labs (8/16): K 4.9, creatinine 1.72 Labs (9/16): BNP 61, TGs 493, HgbA1c 7.1, TSH normal, K 4.3, creatinine 1.3 Labs (10/16): K 4.3, creatinine 1.4 Labs (11/16): K 4.1, creatinine 1.44 Labs (2/17): K 4.3, creatinine 1.24, LDL 92, TGs 201  PMH: 1. HTN: Since at least 2013. 2. Prior ETOH abuse 3. Type II diabetes: Not on meds  currently.  4. Low back pain: h/o disc herniations, s/p back surgery x 2.  5. Stage IIa nodular sclerosing Hodgkin's disease: 4 cycles ABVD (doxorubicin, bleomycin, vinblastine, dacarbazine) in 4/13.  Now in remission.  6. CKD 7. 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  8. Chronic systolic CHF: Nonischemic cardiomyopathy.  8/16 admission to hospital in [place] with acute systolic CHF. Echo showed EF 20%.  He was diuresed and discharged home.  LHC/RHC (10/16) with no significant coronary disease; mean RA 4, PA 29/14, mean PCWP 10, CI 2.21.  Cardiac MRI (10/16) with EF 38%, mild to moderate LVH, normal RV size with RV EF 34%, no late gadolinium enhancement.  - Echo (5/17) with EF 50-55%, mild LVH.   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  Prior heavy ETOH, has cut back a lot (now drinks a 6 pack in a week).   FH: Grandfather with MI in his 55s and died at 36 from [place].  1st cousin with "99% blockage" in her 89s.  No siblings.   ROS: All systems reviewed and negative except as per HPI.   Current Outpatient Prescriptions  Medication Sig Dispense Refill  . acetaminophen (TYLENOL) 500 MG tablet Take 500 mg by mouth every 6 (six) hours as needed for mild pain.    Marland Kitchen  amLODipine (NORVASC) 10 MG tablet TAKE 1 TABLET BY MOUTH DAILY. 30 tablet 3  . aspirin 81 MG tablet Take 81 mg by mouth daily.    . carvedilol (COREG) 25 MG tablet Take 0.5 tablets (12.5 mg total) by mouth 2 (two) times daily. 180 tablet 3  . clonazePAM (KLONOPIN) 1 MG tablet Take 2 mg by mouth 4 (four) times daily as needed for anxiety.     . furosemide (LASIX) 40 MG tablet Take 1 tablet (40 mg total) by mouth daily. 90 tablet 3  . ibuprofen (ADVIL,MOTRIN) 600 MG tablet Take 1 tablet (600 mg total) by mouth every 6 (six) hours as needed. 30 tablet 0  . Multiple Vitamins-Minerals (MULTIVITAMIN WITH MINERALS) tablet Take 1 tablet by mouth daily.    . Omega-3 Fatty Acids (FISH  OIL) 1000 MG CAPS Take 2 capsules (2,000 mg total) by mouth 2 (two) times daily. 60 capsule 0  . sacubitril-valsartan (ENTRESTO) 49-51 MG Take 1 tablet by mouth 2 (two) times daily. 180 tablet 3   No current facility-administered medications for this encounter.   BP 130/84 mmHg  Pulse 60  Wt 220 lb 8 oz (100.018 kg)  SpO2 97% General: NAD Neck: Thick, no JVD, no thyromegaly or thyroid nodule.  Lungs: Clear to auscultation bilaterally with normal respiratory effort. CV: Nondisplaced PMI.  Heart regular S1/S2, no S3/S4, no murmur.  No peripheral edema.  No carotid bruit.  Normal pedal pulses.  Abdomen: Soft, nontender, no hepatosplenomegaly, no distention.  Skin: Intact without lesions or rashes.  Neurologic: Alert and oriented x 3.  Psych: Normal affect. Extremities: No clubbing or cyanosis.  HEENT: Normal.   Assessment/Plan: 1. Chronic systolic CHF: Nonischemic cardiomyopathy.  EF 20% by echo in [place], 38% on cardiac MRI in 10/16.  NYHA class I symptoms currently.  He is not volume overloaded on exam.  Etiology of cardiomyopathy is uncertain.  He has multiple reasons for fall in EF.  He had chemotherapy including doxorubicin back in 2013.  He had uncontrolled HTN for at least several years. He drank heavily up to 8/16 admission. No late gadolinium enhancement on cardiac MRI.  Repeat echo today showed EF up to 50-55%.  NYHA class I, not volume overloaded on exam.  - Keep ETOH minimal. - Continue Coreg 12.5 mg bid.    - Continue Entresto 49/51 bid.  BMET/BNP today.  - Decrease Lasix to 20 mg daily.  After 2 weeks or so, he can stop Lasix altogether.  Restart with significant dyspnea or weight gain. 2. HTN: BP better controlled.   3. Type II diabetes: Per PCP.  4. Hyperlipidemia: Elevated triglycerides.  He is now on fish oil.  Check lipids next appt.   Followup in 6 months.  [name] 03/21/2016

## 2016-03-21 NOTE — Patient Instructions (Signed)
Decrease Furosemide to 20 mg daily for 2 weeks, THEN if stable can stop taking daily and take as needed only  Labs today  We will contact you in 6 months to schedule your next appointment.

## 2016-03-21 NOTE — Progress Notes (Signed)
Echocardiogram 2D Echocardiogram has been performed.  Kevin Reese 03/21/2016, 11:39 AM

## 2016-04-01 MED FILL — AMLODIPINE BESYLATE 10 MG T: 10 | 30 days supply | Qty: 30 | Fill #3

## 2016-04-01 MED FILL — clonazePAM 2 MG TABS: 2 | 30 days supply | Qty: 120 | Fill #0

## 2016-04-28 ENCOUNTER — Other Ambulatory Visit (HOSPITAL_COMMUNITY): Payer: Self-pay | Admitting: *Deleted

## 2016-04-28 MED ORDER — AMLODIPINE BESYLATE 10 MG PO TABS: 10.0000 mg | ORAL_TABLET | Freq: Every day | ORAL | Status: DC

## 2016-04-28 MED FILL — CARVEDILOL 25 MG TABLET: 25 | 90 days supply | Qty: 90 | Fill #1

## 2016-04-28 MED FILL — AMLODIPINE BESYLATE 10 MG T: 10 | 30 days supply | Qty: 30 | Fill #0

## 2016-05-05 MED FILL — clonazePAM 2 MG TABS: 2 | 30 days supply | Qty: 120 | Fill #1

## 2016-06-01 MED FILL — AMLODIPINE BESYLATE 10 MG T: 10 | 30 days supply | Qty: 30 | Fill #1

## 2016-06-08 MED FILL — clonazePAM 2 MG TABS: 2 | 30 days supply | Qty: 120 | Fill #2

## 2016-06-27 MED FILL — AMLODIPINE BESYLATE 10 MG T: 10 | 30 days supply | Qty: 30 | Fill #2

## 2016-07-08 MED FILL — clonazePAM 2 MG TABS: 2 | 30 days supply | Qty: 120 | Fill #3

## 2016-07-25 IMAGING — MR MR CARD MORPHOLOGY WO/W CM
8 of 9 series · 15 of 16 positions shown · IV contrast (38    Multihance)
Comparison: none

CLINICAL DATA: Cardiomyopathy of uncertain etiology

EXAM:
CARDIAC MRI
TECHNIQUE: The patient was scanned on a 1.5 Tesla GE magnet. A dedicated
cardiac coil was used. Functional imaging was done using Fiesta
sequences. [DATE], and 4 chamber views were done to assess for RWMA's.
Modified Candela rule using a short axis stack was used to
calculate an ejection fraction on a dedicated work station using
Circle software. The patient received 30 cc of Multihance. After 10
minutes inversion recovery sequences were used to assess for
infiltration and scar tissue.

[Series 3: bSSFP · sagittal · 8.0mm · 1.64mm/px · 1 of 15 slices shown (1 of 4)]
[im 1/15]
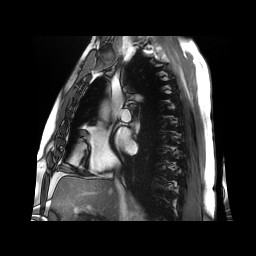

[Series 4: bSSFP · axial · 8.0mm · 1.64mm/px · 1 of 20 slices shown (2 of 4)]
[im 1/20]
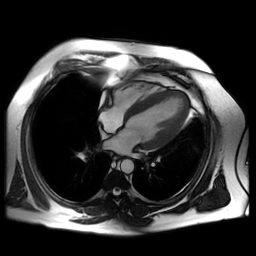

[Series 5: bSSFP · sagittal · 8.0mm · 1.64mm/px · 8 of 280 slices shown (3 of 4)]
[im 1/280]
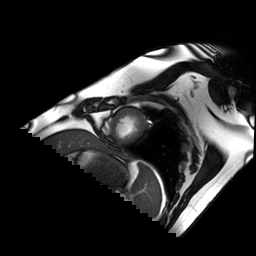
[im 40/280]
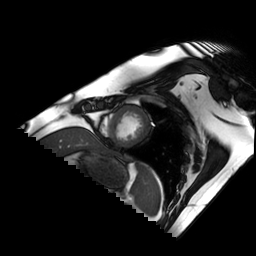
[im 80/280]
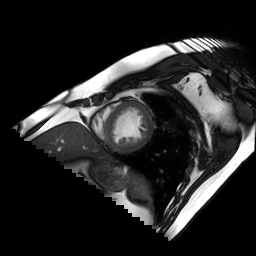
[im 120/280]
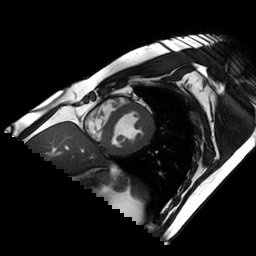
[im 160/280]
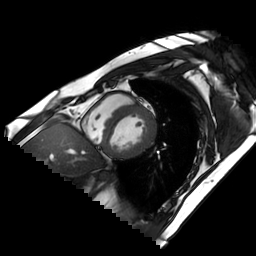
[im 200/280]
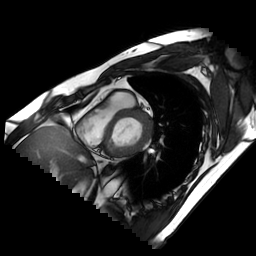
[im 240/280]
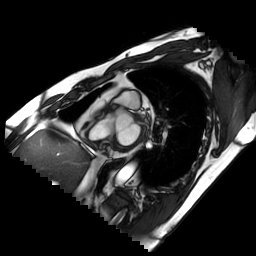
[im 280/280]
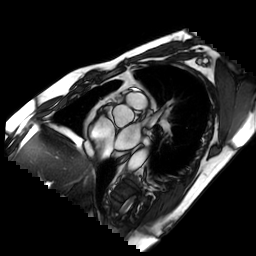

[Series 6: bSSFP · axial · 8.0mm · 1.60mm/px · 1 of 60 slices shown (4 of 4)]
[im 1/60]
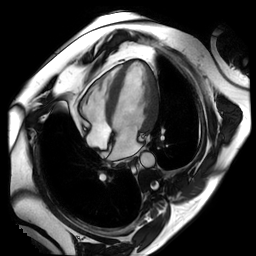

[Series 8: T2 · axial · 8.0mm · 1.56mm/px · 1 of 60 slices shown]
[im 1/60]
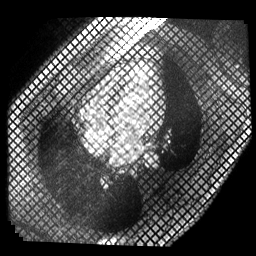

[Series 9: cine ir · oblique · 8.0mm · 1.64mm/px · 1 of 30 slices shown]
[im 1/30]
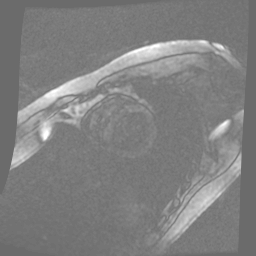

[Series 13: delayed ir prep · sagittal · 8.0mm · 1.68mm/px · 1 of 18 slices shown]
[im 1/18]
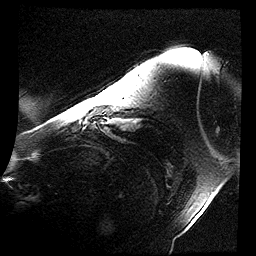

[Series 14: rad delayed ir · axial · 8.0mm · 1.64mm/px · 1 of 3 slices shown]
[im 1/3]
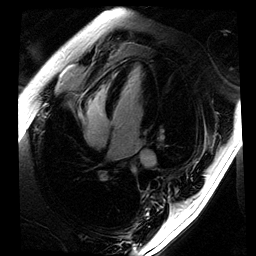

[15 of 16 positions shown; findings below may reference images not displayed]

FINDINGS: Limited images of the lung fields showed no gross abnormalities.

Normal left ventricular size with mild to moderate LV hypertrophy.
EF 38% with moderate diffuse hypokinesis. Normal right ventricular
size with moderate hypokinesis, EF 34%. Normal right and left atrial
sizes. Trileaflet aortic valve with no stenosis and trivial
regurgitation. Mild mitral regurgitation.

On delayed enhancement imaging, there was no myocardial late
gadolinium enhancement (LGE).

MEASUREMENTS:
LV EDV 190 mL

LV SV 72 mL

LV EF 38%

RV EDV 198 mL

RV SV 67 mL

RV EF 34%
IMPRESSION: 1. Normal LV size with mild to moderate LV hypertrophy, EF 34% with
diffuse hypokinesis.

2.  Normal RV size with moderate systolic dysfunction, EF 34%.

3. No myocardial LGE, so no definitive evidence for prior MI,
infiltrative disease, or myocarditis.

Mahtab Cuartas

## 2016-08-08 MED FILL — clonazePAM 2 MG TABS: 2 | 30 days supply | Qty: 120 | Fill #4

## 2016-08-08 MED FILL — AMLODIPINE BESYLATE 10 MG T: 10 | 30 days supply | Qty: 30 | Fill #3

## 2016-09-01 MED FILL — FUROSEMIDE 40 MG TABLET: 40 | 30 days supply | Qty: 30 | Fill #1

## 2016-09-01 MED FILL — CARVEDILOL 25 MG TABLET: 25 | 30 days supply | Qty: 30 | Fill #2

## 2016-09-07 ENCOUNTER — Telehealth (HOSPITAL_COMMUNITY): Payer: Self-pay | Admitting: Vascular Surgery

## 2016-09-07 MED FILL — clonazePAM 2 MG TABS: 2 | 13 days supply | Qty: 52 | Fill #0

## 2016-09-07 NOTE — Telephone Encounter (Signed)
Refill Amlodipine .Marland Kitchen Pt is completely out of medication , pt call OP pharmacy they told him they sent refill request 3 times and no response.Marland Kitchen Please advise

## 2016-09-08 ENCOUNTER — Other Ambulatory Visit (HOSPITAL_COMMUNITY): Payer: Self-pay | Admitting: *Deleted

## 2016-09-08 MED ORDER — AMLODIPINE BESYLATE 10 MG PO TABS: 10.0000 mg | ORAL_TABLET | Freq: Every day | ORAL | 3 refills | Status: DC

## 2016-09-08 MED FILL — AMLODIPINE BESYLATE 10 MG T: 10 | 30 days supply | Qty: 30 | Fill #0

## 2016-09-12 ENCOUNTER — Other Ambulatory Visit (HOSPITAL_COMMUNITY): Payer: Self-pay | Admitting: *Deleted

## 2016-09-12 MED ORDER — AMLODIPINE BESYLATE 10 MG PO TABS
10.0000 mg | ORAL_TABLET | Freq: Every day | ORAL | 3 refills | Status: DC
Start: 2016-09-12 — End: 2017-02-01

## 2016-10-03 ENCOUNTER — Encounter (HOSPITAL_COMMUNITY): Payer: Self-pay

## 2016-10-03 ENCOUNTER — Ambulatory Visit (HOSPITAL_COMMUNITY)
Admission: RE | Admit: 2016-10-03 | Discharge: 2016-10-03 | Disposition: A | Discharge status: Home / Self Care | Payer: Self-pay | Source: Ambulatory Visit | Attending: Cardiology | Admitting: Cardiology

## 2016-10-03 VITALS — BP 130/88 | HR 51 | Wt 217.4 lb

## 2016-10-03 DIAGNOSIS — C8190 Hodgkin lymphoma, unspecified, unspecified site: Secondary | ICD-10-CM | POA: Insufficient documentation

## 2016-10-03 DIAGNOSIS — E1122 Type 2 diabetes mellitus with diabetic chronic kidney disease: Secondary | ICD-10-CM | POA: Insufficient documentation

## 2016-10-03 DIAGNOSIS — I5022 Chronic systolic (congestive) heart failure: Secondary | ICD-10-CM | POA: Insufficient documentation

## 2016-10-03 DIAGNOSIS — I130 Hypertensive heart and chronic kidney disease with heart failure and stage 1 through stage 4 chronic kidney disease, or unspecified chronic kidney disease: Secondary | ICD-10-CM | POA: Insufficient documentation

## 2016-10-03 DIAGNOSIS — Z7982 Long term (current) use of aspirin: Secondary | ICD-10-CM | POA: Insufficient documentation

## 2016-10-03 DIAGNOSIS — E785 Hyperlipidemia, unspecified: Secondary | ICD-10-CM | POA: Insufficient documentation

## 2016-10-03 DIAGNOSIS — Z794 Long term (current) use of insulin: Secondary | ICD-10-CM | POA: Insufficient documentation

## 2016-10-03 DIAGNOSIS — E1140 Type 2 diabetes mellitus with diabetic neuropathy, unspecified: Secondary | ICD-10-CM | POA: Insufficient documentation

## 2016-10-03 DIAGNOSIS — N189 Chronic kidney disease, unspecified: Secondary | ICD-10-CM | POA: Insufficient documentation

## 2016-10-03 DIAGNOSIS — I10 Essential (primary) hypertension: Secondary | ICD-10-CM

## 2016-10-03 DIAGNOSIS — Z9221 Personal history of antineoplastic chemotherapy: Secondary | ICD-10-CM | POA: Insufficient documentation

## 2016-10-03 DIAGNOSIS — Z79899 Other long term (current) drug therapy: Secondary | ICD-10-CM | POA: Insufficient documentation

## 2016-10-03 DIAGNOSIS — I429 Cardiomyopathy, unspecified: Secondary | ICD-10-CM | POA: Insufficient documentation

## 2016-10-03 LAB — BASIC METABOLIC PANEL
Anion gap: 6 (ref 5–15)
BUN: 14 mg/dL (ref 6–20)
CALCIUM: 10 mg/dL (ref 8.9–10.3)
CO2: 24 mmol/L (ref 22–32)
Chloride: 107 mmol/L (ref 101–111)
Creatinine, Ser: 1.09 mg/dL (ref 0.61–1.24)
GFR calc Af Amer: >60 mL/min (ref >60)
GLUCOSE: 111 mg/dL — AB (ref 65–99)
Potassium: 4.5 mmol/L (ref 3.5–5.1)
Sodium: 137 mmol/L (ref 135–145)

## 2016-10-03 LAB — LIPID PANEL
CHOL/HDL RATIO: 4.3 ratio
Cholesterol: 94 mg/dL (ref 0–200)
HDL: 22 mg/dL — AB (ref >40)
LDL CALC: 43 mg/dL (ref 0–99)
TRIGLYCERIDES: 145 mg/dL (ref <150)
VLDL: 29 mg/dL (ref 0–40)

## 2016-10-03 MED ORDER — FUROSEMIDE 20 MG PO TABS: 20.0000 mg | ORAL_TABLET | Freq: Every day | ORAL | 1 refills | Status: DC

## 2016-10-03 MED FILL — FUROSEMIDE 20 MG TABLET: 20 | 30 days supply | Qty: 30 | Fill #0

## 2016-10-03 MED FILL — CARVEDILOL 25 MG TABLET: 25 | 30 days supply | Qty: 30 | Fill #3

## 2016-10-03 MED FILL — AMLODIPINE BESYLATE 10 MG T: 10 | 30 days supply | Qty: 30 | Fill #0

## 2016-10-03 MED FILL — clonazePAM 2 MG TABS: 2 | 30 days supply | Qty: 120 | Fill #0

## 2016-10-03 NOTE — Patient Instructions (Addendum)
Labs today (will call for abnormal results, otherwise no news is good news)  Decrease lasix to 20 mg (1 Tab) Once Daily.  If weight is stable and no shortness of breath, stop taking completely.  Follow up in 6 months with Echo

## 2016-10-03 NOTE — Progress Notes (Signed)
Patient ID: Kevin Reese, male   DOB: Jan 24, 1981, 35 y.o.   MRN: [id number] Oncologist: Dr. Marin Olp Cardiologist: Dr. Aundra Dubin PCP: Dr. Arcelia Jew  35 yo with history of HTN, diabetes, Hodgkins disease, CKD, and chronic systolic CHF presents for CHF clinic evaluation.  Patient has had HTN since at least 2013.  It has only been sporadically treated.  He has a diagnosis of diabetes but is not on meds. He was treated for Hodgkins disease in 2013 with ABVD which contains doxorubicin.  He was treated in [place], and does not appear to have had a prior echo done here. He was a heavy drinker in the past, he has cut this back to about a 6-pack a week at most.    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  Finally, he was admitted in 8/16 to a hospital in [place] (had been up there for work).  EF was found to be 20% and he had pulmonary edema.  BP was > 200/100 at admission (he had not been on BP meds).  He was diuresed and BP was controlled.  He was sent home on a regimen of cardiac and BP meds.    I had him do LHC/RHC in 10/16.  This showed normal filling pressures and cardiac output, no significant coronary disease.  Cardiac MRI in 10/16 showed LV EF 38% with normal RV size and RV EF 34%.  Repeat echo in 5/17 with ongoing medical management showed EF improved to 50-55%.    He is doing quite well now.  Back at work landscaping full time.  No exertional dyspnea.  No orthopnea/PND. No chest pain.  Really no limitations at this point. He remains on Entresto and Coreg.  He is still taking Lasix.  Weight is down 3 lbs.   Labs (8/16): K 4.9, creatinine 1.72 Labs (9/16): BNP 61, TGs 493, HgbA1c 7.1, TSH normal, K 4.3, creatinine 1.3 Labs (10/16): K 4.3, creatinine 1.4 Labs (11/16): K 4.1, creatinine 1.44 Labs (2/17): K 4.3, creatinine 1.24, LDL 92, TGs 201 Labs (5/17): K 4.4, creatinine 1.28, BNP  15  PMH: 1. HTN: Since at least 2013. 2. Prior ETOH abuse 3. Type II diabetes: Not on meds currently.  4. Low back pain: h/o disc herniations, s/p back surgery x 2.  5. Stage IIa nodular sclerosing Hodgkin's disease: 4 cycles ABVD (doxorubicin, bleomycin, vinblastine, dacarbazine) in 4/13.  Now in remission.  6. CKD 7. 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  8. Chronic systolic CHF: Nonischemic cardiomyopathy.  8/16 admission to hospital in [place] with acute systolic CHF. Echo showed EF 20%.  He was diuresed and discharged home.  LHC/RHC (10/16) with no significant coronary disease; mean RA 4, PA 29/14, mean PCWP 10, CI 2.21.  Cardiac MRI (10/16) with EF 38%, mild to moderate LVH, normal RV size with RV EF 34%, no late gadolinium enhancement.  - Echo (5/17) with EF 50-55%, mild LVH.   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  Prior heavy ETOH, has cut back a lot (now drinks a 6 pack in a week).   FH: Grandfather with MI in his 38s and died at 82 from [place].  1st cousin with "99% blockage" in her 64s.  No siblings.   ROS: All systems reviewed and negative except as per HPI.   Current Outpatient Prescriptions  Medication Sig Dispense Refill  .  acetaminophen (TYLENOL) 500 MG tablet Take 500 mg by mouth every 6 (six) hours as needed for mild pain.    Marland Kitchen amLODipine (NORVASC) 10 MG tablet Take 1 tablet (10 mg total) by mouth daily. 30 tablet 3  . aspirin 81 MG tablet Take 81 mg by mouth daily.    . carvedilol (COREG) 25 MG tablet Take 0.5 tablets (12.5 mg total) by mouth 2 (two) times daily. 180 tablet 3  . clonazePAM (KLONOPIN) 1 MG tablet Take 2 mg by mouth 4 (four) times daily as needed for anxiety.     . furosemide (LASIX) 20 MG tablet Take 1 tablet (20 mg total) by mouth daily. 30 tablet 1  . ibuprofen (ADVIL,MOTRIN) 600 MG tablet Take 1 tablet (600 mg total) by mouth every 6 (six) hours as needed. 30 tablet 0  . Multiple  Vitamins-Minerals (MULTIVITAMIN WITH MINERALS) tablet Take 1 tablet by mouth daily.    . sacubitril-valsartan (ENTRESTO) 49-51 MG Take 1 tablet by mouth 2 (two) times daily. 180 tablet 3   No current facility-administered medications for this encounter.    BP 130/88   Pulse (!) 51   Wt 217 lb 6.4 oz (98.6 kg)   SpO2 98%   BMI 30.32 kg/m  General: NAD Neck: Thick, no JVD, no thyromegaly or thyroid nodule.  Lungs: Clear to auscultation bilaterally with normal respiratory effort. CV: Nondisplaced PMI.  Heart regular S1/S2, no S3/S4, no murmur.  No peripheral edema.  No carotid bruit.  Normal pedal pulses.  Abdomen: Soft, nontender, no hepatosplenomegaly, no distention.  Skin: Intact without lesions or rashes.  Neurologic: Alert and oriented x 3.  Psych: Normal affect. Extremities: No clubbing or cyanosis.  HEENT: Normal.   Assessment/Plan: 1. Chronic systolic CHF: Nonischemic cardiomyopathy.  EF 20% by echo in [place], 38% on cardiac MRI in 10/16.  NYHA class I symptoms currently.  He is not volume overloaded on exam.  Etiology of cardiomyopathy is uncertain.  He has multiple reasons for fall in EF.  He had chemotherapy including doxorubicin back in 2013.  He had uncontrolled HTN for at least several years. He drank heavily up to 8/16 admission. No late gadolinium enhancement on cardiac MRI.  Repeat echo in 5/17 showed EF up to 50-55%.  NYHA class I, not volume overloaded on exam.  - Keep ETOH minimal. - Continue Coreg 12.5 mg bid.    - Continue Entresto 49/51 bid.  BMET today.  - Decrease Lasix to 20 mg daily.  After 2 weeks or so, he can stop Lasix altogether.  Restart with significant dyspnea or weight gain. - Followup in 5/18 with repeat echo.  2. HTN: BP better controlled.   3. Type II diabetes: Per PCP.  4. Hyperlipidemia: Not taking fish oil.  Triglycerides have been high in the past, check lipids today.   [name] 10/03/2016

## 2016-10-04 ENCOUNTER — Telehealth (HOSPITAL_COMMUNITY): Payer: Self-pay | Admitting: *Deleted

## 2016-10-04 NOTE — Telephone Encounter (Signed)
Pt called requesting refill of his Delene Loll, he states he gets it from [facility] pt asst and they told him he needs a new rx.  RX faxed to them at [phone]

## 2016-10-06 ENCOUNTER — Encounter (HOSPITAL_COMMUNITY): Payer: Self-pay | Admitting: Pharmacist

## 2016-10-18 NOTE — Telephone Encounter (Signed)
open in error

## 2016-11-09 ENCOUNTER — Other Ambulatory Visit (HOSPITAL_COMMUNITY): Payer: Self-pay | Admitting: *Deleted

## 2016-11-09 ENCOUNTER — Telehealth (HOSPITAL_COMMUNITY): Payer: Self-pay | Admitting: *Deleted

## 2016-11-09 NOTE — Telephone Encounter (Signed)
Patient left vm requesting entresto be sent to a mail order pharmacy but did not specify which pharmacy. Called patient he did not answer and his voicemail box isnt set up.  Will try patient again today.

## 2016-11-11 MED FILL — AMLODIPINE BESYLATE 10 MG T: 10 | 30 days supply | Qty: 30 | Fill #1

## 2016-11-11 MED FILL — clonazePAM 2 MG TABS: 2 | 30 days supply | Qty: 120 | Fill #1

## 2016-11-14 ENCOUNTER — Telehealth (HOSPITAL_COMMUNITY): Payer: Self-pay | Admitting: Pharmacist

## 2016-11-14 NOTE — Telephone Encounter (Signed)
[employer] patient assistance approved for Entresto 49-51 mg BID through 11/13/17.   Ruta Hinds. Velva Harman, PharmD, BCPS, CPP Clinical Pharmacist Pager: [phone] Phone: [phone] 11/14/2016 10:49 AM

## 2016-11-30 ENCOUNTER — Telehealth (HOSPITAL_COMMUNITY): Payer: Self-pay | Admitting: *Deleted

## 2016-11-30 NOTE — Telephone Encounter (Signed)
Patient called in saying he had not received his Entresto 49/51 mg medication in the mail yet. He said he called the day he turned in paperwork for the patient assistance approval.  I called [employer] Patient assistance [employer] to check on status.  Entresto had not been mailed yet, they reached out to patient regarding refill but they were unable to reach patient and unable to leave VM.  They are shipping it today and should arrive this Friday.  I have made patient aware, he has enough to last until Friday.  I also educated him on the need to notify [employer] in advance for future refills.  He understands with no further questions.

## 2016-12-08 MED FILL — CARVEDILOL 25 MG TABLET: 25 | 30 days supply | Qty: 30 | Fill #4

## 2016-12-08 MED FILL — AMLODIPINE BESYLATE 10 MG T: 10 | 30 days supply | Qty: 30 | Fill #2

## 2016-12-15 MED FILL — clonazePAM 2 MG TABS: 2 | 30 days supply | Qty: 120 | Fill #2

## 2017-01-16 ENCOUNTER — Other Ambulatory Visit (HOSPITAL_COMMUNITY): Payer: Self-pay | Admitting: Internal Medicine

## 2017-01-16 MED FILL — AMLODIPINE BESYLATE 10 MG T: 10 | 30 days supply | Qty: 30 | Fill #3

## 2017-01-16 MED FILL — clonazePAM 2 MG TABS: 2 | 30 days supply | Qty: 120 | Fill #3

## 2017-02-01 ENCOUNTER — Other Ambulatory Visit (HOSPITAL_COMMUNITY): Payer: Self-pay | Admitting: *Deleted

## 2017-02-01 ENCOUNTER — Telehealth (HOSPITAL_COMMUNITY): Payer: Self-pay | Admitting: *Deleted

## 2017-02-01 ENCOUNTER — Other Ambulatory Visit (HOSPITAL_COMMUNITY): Payer: Self-pay | Admitting: Pharmacist

## 2017-02-01 MED ORDER — FUROSEMIDE 20 MG PO TABS: 20.0000 mg | ORAL_TABLET | Freq: Every day | ORAL | 2 refills | Status: DC

## 2017-02-01 MED ORDER — AMLODIPINE BESYLATE 10 MG PO TABS: 10.0000 mg | ORAL_TABLET | Freq: Every day | ORAL | 2 refills | Status: DC

## 2017-02-01 MED ORDER — CARVEDILOL 25 MG PO TABS
12.5000 mg | ORAL_TABLET | Freq: Two times a day (BID) | ORAL | 2 refills | Status: DC
Start: 2017-02-01 — End: 2017-05-24

## 2017-02-01 MED FILL — CARVEDILOL 12.5 MG TABLET: 12.5 | 34 days supply | Qty: 68 | Fill #0

## 2017-02-01 MED FILL — AMLODIPINE BESYLATE 10 MG T: 10 | 34 days supply | Qty: 34 | Fill #0

## 2017-02-01 MED FILL — FUROSEMIDE 20 MG TABLET: 20 | 100 days supply | Qty: 100 | Fill #0

## 2017-02-01 NOTE — Telephone Encounter (Signed)
[employer] Fund form completed and faxed to [facility] for refills on Furosemide, amlodipine, and carvedilol. Faxed to [phone]

## 2017-02-24 MED FILL — clonazePAM 2 MG TABS: 2 | 30 days supply | Qty: 120 | Fill #4

## 2017-04-07 DIAGNOSIS — M545 Low back pain: Secondary | ICD-10-CM

## 2017-04-07 DIAGNOSIS — S61412A Laceration without foreign body of left hand, initial encounter: Principal | ICD-10-CM

## 2017-04-07 DIAGNOSIS — M25522 Pain in left elbow: Secondary | ICD-10-CM

## 2017-04-07 DIAGNOSIS — M79632 Pain in left forearm: Secondary | ICD-10-CM

## 2017-04-07 DIAGNOSIS — F17200 Nicotine dependence, unspecified, uncomplicated: Secondary | ICD-10-CM

## 2017-04-07 DIAGNOSIS — M25521 Pain in right elbow: Secondary | ICD-10-CM

## 2017-04-07 MED ORDER — HYDROCODONE-ACETAMINOPHEN 5-325 MG PO TABS
1 | ORAL_TABLET | Freq: Once | ORAL | Status: CP
Start: 2017-04-07 — End: ?

## 2017-04-07 MED ORDER — GABAPENTIN 300 MG PO CAPS
Freq: Three times a day (TID) | ORAL
Start: 2017-04-07 — End: ?

## 2017-04-07 MED ORDER — LIDOCAINE HCL (PF) 1 % IJ SOLN SH
Freq: Once | Status: CP
Start: 2017-04-07 — End: ?

## 2017-04-07 MED ORDER — BUPROPION HCL ER (SR) 150 MG PO TB12
ORAL
Start: 2017-04-07 — End: ?

## 2017-04-07 MED ORDER — FLUCONAZOLE 200 MG PO TABS
Freq: Every day | ORAL
Start: 2017-04-07 — End: ?

## 2017-04-07 MED FILL — clonazePAM 2 MG TABS: 2 | 17 days supply | Qty: 68 | Fill #0

## 2017-04-08 ENCOUNTER — Emergency Department: Admit: 2017-04-08 | Discharge: 2017-04-08

## 2017-04-08 ENCOUNTER — Inpatient Hospital Stay: Admit: 2017-04-08 | Discharge: 2017-04-08

## 2017-04-08 DIAGNOSIS — M545 Low back pain: Secondary | ICD-10-CM

## 2017-04-08 DIAGNOSIS — S61412A Laceration without foreign body of left hand, initial encounter: Principal | ICD-10-CM

## 2017-04-08 DIAGNOSIS — F17200 Nicotine dependence, unspecified, uncomplicated: Secondary | ICD-10-CM

## 2017-04-08 DIAGNOSIS — M79632 Pain in left forearm: Secondary | ICD-10-CM

## 2017-04-08 DIAGNOSIS — M25521 Pain in right elbow: Secondary | ICD-10-CM

## 2017-04-08 MED ORDER — HYDROCODONE-ACETAMINOPHEN 5-325 MG PO TABS
1 | ORAL_TABLET | Freq: Once | ORAL | Status: CP
Start: 2017-04-08 — End: ?
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
## 2017-04-08 NOTE — ED Provider Notes
Spouse name: N/A   ? Number of children: N/A   ? Years of education: N/A     Social History Main Topics   ? Smoking status: Current Some Day Smoker   ? Smokeless tobacco: None   ? Alcohol use No   ? Drug use: No   ? Sexual activity: Not Asked     Other Topics Concern   ? None     Social History Narrative   ? None       Review of Systems   Constitutional: Negative for fever, diaphoresis and fatigue.   Respiratory: Negative for cough, shortness of breath and wheezing.    Cardiovascular: Negative for chest pain, palpitations and leg swelling.   Gastrointestinal: Negative for nausea, vomiting, abdominal pain, diarrhea and constipation.   Genitourinary: Negative for dysuria, urgency and frequency.   Musculoskeletal: Positive for myalgias and back pain.   Skin: Positive for wound.   Neurological: Negative for loss of consciousness, weakness, light-headedness, numbness and headaches.   All other systems reviewed and are negative.      Physical Exam     ED Triage Vitals   BP 04/07/17 2018 (!) 172/100   Pulse 04/07/17 2017 103   Resp 04/07/17 2017 16   Temp 04/07/17 2017 37.3 ?C (99.1 ?F)   Temp src 04/07/17 2017 Oral   Height 04/07/17 2017 1.803 m   Weight 04/07/17 2017 113.4 kg   SpO2 04/07/17 2017 98 %   BMI (Calculated) 04/07/17 2017 34.94             Physical Exam   Constitutional: He is oriented to person, place, and time. He appears well-developed and well-nourished. No distress.   HENT:   Head: Normocephalic and atraumatic.   Eyes: Conjunctivae and EOM are normal. Pupils are equal, round, and reactive to light.   Neck: Normal range of motion. Neck supple.   Cardiovascular: Normal rate, regular rhythm, normal heart sounds and intact distal pulses.  Exam reveals no gallop and no friction rub.    No murmur heard.  Pulmonary/Chest: Effort normal and breath sounds normal. No respiratory distress. He has no wheezes. He exhibits no tenderness.   Abdominal: Soft. Bowel sounds are normal. There is no tenderness. There is

## 2017-04-08 NOTE — ED Notes
Time of discharge: 0152  AM., [facility]  Patient discharged  ambulatory. to exit with belongings in  Stable condition.  Patient escorted by  LEA., Written discharge instructions given to  LEA.  Patient/recipient  verbalizes discharge instructions.
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
## 2017-04-08 NOTE — ED Provider Notes
?   Number of children: N/A   ? Years of education: N/A     Social History Main Topics   ? Smoking status: Current Some Day Smoker   ? Smokeless tobacco: None   ? Alcohol use No   ? Drug use: No   ? Sexual activity: Not Asked     Other Topics Concern   ? None     Social History Narrative   ? None       Review of Systems   Constitutional: Negative for fever, diaphoresis and fatigue.   Respiratory: Negative for cough, shortness of breath and wheezing.    Cardiovascular: Negative for chest pain, palpitations and leg swelling.   Gastrointestinal: Negative for nausea, vomiting, abdominal pain, diarrhea and constipation.   Genitourinary: Negative for dysuria, urgency and frequency.   Musculoskeletal: Positive for myalgias and back pain.   Skin: Positive for wound.   Neurological: Negative for loss of consciousness, weakness, light-headedness, numbness and headaches.   All other systems reviewed and are negative.      Physical Exam     ED Triage Vitals   BP 04/07/17 2018 (!) 172/100   Pulse 04/07/17 2017 103   Resp 04/07/17 2017 16   Temp 04/07/17 2017 37.3 ?C (99.1 ?F)   Temp src 04/07/17 2017 Oral   Height 04/07/17 2017 1.803 m   Weight 04/07/17 2017 113.4 kg   SpO2 04/07/17 2017 98 %   BMI (Calculated) 04/07/17 2017 34.94             Physical Exam   Constitutional: He is oriented to person, place, and time. He appears well-developed and well-nourished. No distress.   HENT:   Head: Normocephalic and atraumatic.   Eyes: Conjunctivae and EOM are normal. Pupils are equal, round, and reactive to light.   Neck: Normal range of motion. Neck supple.   Cardiovascular: Normal rate, regular rhythm, normal heart sounds and intact distal pulses.  Exam reveals no gallop and no friction rub.    No murmur heard.  Pulmonary/Chest: Effort normal and breath sounds normal. No respiratory distress. He has no wheezes. He exhibits no tenderness.   Abdominal: Soft. Bowel sounds are normal. There is no tenderness. There is
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
## 2017-04-08 NOTE — ED Provider Notes
Medical Evaluation Initiated:  Yes, filed at 04/07/17 2247  by Gwendlyn Deutscheravornwattana, Jerry, MD

## 2017-04-26 MED FILL — AMLODIPINE BESYLATE 10 MG T: 10 | 34 days supply | Qty: 34 | Fill #1

## 2017-04-26 MED FILL — CARVEDILOL 12.5 MG TABLET: 12.5 | 34 days supply | Qty: 68 | Fill #1

## 2017-05-24 ENCOUNTER — Encounter (HOSPITAL_COMMUNITY): Payer: Self-pay | Admitting: Cardiology

## 2017-05-24 ENCOUNTER — Ambulatory Visit (HOSPITAL_COMMUNITY)
Admission: RE | Admit: 2017-05-24 | Discharge: 2017-05-24 | Disposition: A | Discharge status: Home / Self Care | Payer: Self-pay | Source: Ambulatory Visit | Attending: Cardiology | Admitting: Cardiology

## 2017-05-24 ENCOUNTER — Ambulatory Visit (HOSPITAL_BASED_OUTPATIENT_CLINIC_OR_DEPARTMENT_OTHER)
Admission: RE | Admit: 2017-05-24 | Discharge: 2017-05-24 | Disposition: A | Discharge status: Home / Self Care | Payer: Self-pay | Source: Ambulatory Visit | Attending: Cardiology | Admitting: Cardiology

## 2017-05-24 VITALS — BP 110/84 | HR 53 | Wt 214.8 lb

## 2017-05-24 DIAGNOSIS — I130 Hypertensive heart and chronic kidney disease with heart failure and stage 1 through stage 4 chronic kidney disease, or unspecified chronic kidney disease: Secondary | ICD-10-CM | POA: Insufficient documentation

## 2017-05-24 DIAGNOSIS — E1140 Type 2 diabetes mellitus with diabetic neuropathy, unspecified: Secondary | ICD-10-CM | POA: Insufficient documentation

## 2017-05-24 DIAGNOSIS — Z7982 Long term (current) use of aspirin: Secondary | ICD-10-CM | POA: Insufficient documentation

## 2017-05-24 DIAGNOSIS — Z9221 Personal history of antineoplastic chemotherapy: Secondary | ICD-10-CM | POA: Insufficient documentation

## 2017-05-24 DIAGNOSIS — I10 Essential (primary) hypertension: Secondary | ICD-10-CM

## 2017-05-24 DIAGNOSIS — I5022 Chronic systolic (congestive) heart failure: Secondary | ICD-10-CM | POA: Insufficient documentation

## 2017-05-24 DIAGNOSIS — E785 Hyperlipidemia, unspecified: Secondary | ICD-10-CM | POA: Insufficient documentation

## 2017-05-24 DIAGNOSIS — N189 Chronic kidney disease, unspecified: Secondary | ICD-10-CM | POA: Insufficient documentation

## 2017-05-24 DIAGNOSIS — Z8249 Family history of ischemic heart disease and other diseases of the circulatory system: Secondary | ICD-10-CM | POA: Insufficient documentation

## 2017-05-24 DIAGNOSIS — E1122 Type 2 diabetes mellitus with diabetic chronic kidney disease: Secondary | ICD-10-CM | POA: Insufficient documentation

## 2017-05-24 DIAGNOSIS — C8110 Nodular sclerosis classical Hodgkin lymphoma, unspecified site: Secondary | ICD-10-CM | POA: Insufficient documentation

## 2017-05-24 DIAGNOSIS — I428 Other cardiomyopathies: Secondary | ICD-10-CM | POA: Insufficient documentation

## 2017-05-24 DIAGNOSIS — Z79899 Other long term (current) drug therapy: Secondary | ICD-10-CM | POA: Insufficient documentation

## 2017-05-24 LAB — ECHOCARDIOGRAM COMPLETE
AVLVOTPG: 3 mmHg
Ao-asc: 32 cm
CHL CUP LV S' LATERAL: 6.53 cm/s
E decel time: 254 msec
EERAT: 5.7
FS: 33 % (ref 28–44)
IVS/LV PW RATIO, ED: 0.85
LA ID, A-P, ES: 36 mm
LA diam end sys: 36 mm
LA vol index: 23.3 mL/m2
LA vol: 50.8 mL
LADIAMINDEX: 1.65 cm/m2
LAVOLA4C: 44.1 mL
LV E/e' medial: 5.7
LV PW d: 14.9 mm — AB (ref 0.6–1.1)
LV SIMPSON'S DISK: 56
LV TDI E'MEDIAL: 8.05
LV dias vol index: 53 mL/m2
LV dias vol: 115 mL (ref 62–150)
LVEEAVG: 5.7
LVELAT: 9.68 cm/s
LVOT SV: 61 mL
LVOT VTI: 14.6 cm
LVOT area: 4.15 cm2
LVOT diameter: 23 mm
LVOT peak vel: 89.3 cm/s
LVSYSVOL: 51 mL (ref 21–61)
LVSYSVOLIN: 23 mL/m2
Lateral S' vel: 11.1 cm/s
MV Dec: 254
MVPKAVEL: 47 m/s
MVPKEVEL: 55.2 m/s
RV TAPSE: 20.4 mm
Stroke v: 64 ml
TDI e' lateral: 9.68

## 2017-05-24 LAB — BASIC METABOLIC PANEL
Anion gap: 8 (ref 5–15)
BUN: 16 mg/dL (ref 6–20)
CHLORIDE: 103 mmol/L (ref 101–111)
CO2: 25 mmol/L (ref 22–32)
CREATININE: 1.23 mg/dL (ref 0.61–1.24)
Calcium: 9.5 mg/dL (ref 8.9–10.3)
GFR calc non Af Amer: >60 mL/min (ref >60)
Glucose, Bld: 126 mg/dL — ABNORMAL HIGH (ref 65–99)
POTASSIUM: 4.8 mmol/L (ref 3.5–5.1)
SODIUM: 136 mmol/L (ref 135–145)

## 2017-05-24 MED ORDER — CARVEDILOL 25 MG PO TABS: 12.5000 mg | ORAL_TABLET | Freq: Two times a day (BID) | ORAL | 2 refills | Status: DC

## 2017-05-24 NOTE — Patient Instructions (Signed)
Change Carvedilol to 12.5 mg (1/2 tab) Twice daily   Lab today  Lab in 3 months  We will contact you in 6 months to schedule your next appointment.

## 2017-05-24 NOTE — Progress Notes (Signed)
  Echocardiogram 2D Echocardiogram has been performed.  Bobbye Charleston 05/24/2017, 9:36 AM
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
## 2017-06-28 MED FILL — AMLODIPINE BESYLATE 10 MG T: 10 | 34 days supply | Qty: 34 | Fill #2

## 2017-06-28 MED FILL — CARVEDILOL 12.5 MG TABLET: 12.5 | 34 days supply | Qty: 68 | Fill #2

## 2017-08-24 ENCOUNTER — Other Ambulatory Visit (HOSPITAL_COMMUNITY): Payer: Self-pay

## 2017-09-18 ENCOUNTER — Other Ambulatory Visit (HOSPITAL_COMMUNITY): Payer: Self-pay | Admitting: *Deleted

## 2017-09-18 ENCOUNTER — Ambulatory Visit (HOSPITAL_COMMUNITY)
Admission: RE | Admit: 2017-09-18 | Discharge: 2017-09-18 | Disposition: A | Discharge status: Home / Self Care | Payer: Self-pay | Source: Ambulatory Visit | Attending: Cardiology | Admitting: Cardiology

## 2017-09-18 DIAGNOSIS — I5022 Chronic systolic (congestive) heart failure: Secondary | ICD-10-CM | POA: Insufficient documentation

## 2017-09-18 LAB — BASIC METABOLIC PANEL
ANION GAP: 6 (ref 5–15)
BUN: 11 mg/dL (ref 6–20)
CHLORIDE: 105 mmol/L (ref 101–111)
CO2: 26 mmol/L (ref 22–32)
Calcium: 9.3 mg/dL (ref 8.9–10.3)
Creatinine, Ser: 1.27 mg/dL — ABNORMAL HIGH (ref 0.61–1.24)
GFR calc Af Amer: >60 mL/min (ref >60)
GLUCOSE: 133 mg/dL — AB (ref 65–99)
POTASSIUM: 3.9 mmol/L (ref 3.5–5.1)
Sodium: 137 mmol/L (ref 135–145)

## 2017-09-18 MED ORDER — AMLODIPINE BESYLATE 10 MG PO TABS: 10.0000 mg | ORAL_TABLET | Freq: Every day | ORAL | 2 refills | Status: DC

## 2017-09-18 MED FILL — AMLODIPINE BESYLATE 10 MG T: 10 | 30 days supply | Qty: 30 | Fill #0

## 2017-12-19 ENCOUNTER — Telehealth: Payer: Self-pay | Admitting: Hematology & Oncology

## 2017-12-19 NOTE — Telephone Encounter (Signed)
Faxed medical records to: [facility], [place] [facility] P: [phone] F: [phone]       COPY SCANNED

## 2018-02-05 ENCOUNTER — Other Ambulatory Visit (HOSPITAL_COMMUNITY): Payer: Self-pay

## 2018-02-05 MED ORDER — CARVEDILOL 25 MG PO TABS: 12.5000 mg | ORAL_TABLET | Freq: Two times a day (BID) | ORAL | 1 refills | Status: DC

## 2018-04-03 ENCOUNTER — Other Ambulatory Visit (HOSPITAL_COMMUNITY): Payer: Self-pay | Admitting: *Deleted

## 2018-04-03 MED ORDER — AMLODIPINE BESYLATE 10 MG PO TABS: 10.0000 mg | ORAL_TABLET | Freq: Every day | ORAL | 2 refills | Status: DC

## 2018-04-03 MED FILL — AMLODIPINE BESYLATE 10 MG T: 10 | 30 days supply | Qty: 30 | Fill #0

## 2018-04-10 ENCOUNTER — Encounter (HOSPITAL_COMMUNITY): Payer: Self-pay | Admitting: Cardiology

## 2018-05-04 MED FILL — AMLODIPINE BESYLATE 10 MG T: 10 | 30 days supply | Qty: 30 | Fill #1 | Status: TO

## 2018-05-25 ENCOUNTER — Encounter (HOSPITAL_COMMUNITY): Payer: Self-pay | Admitting: Cardiology

## 2018-11-03 ENCOUNTER — Telehealth: Payer: Self-pay | Admitting: Hematology & Oncology

## 2018-11-03 NOTE — Telephone Encounter (Signed)
REQUEST FAXED TO: [facility] [place] [phone]  REQUEST INDEXED

## 2018-11-28 ENCOUNTER — Telehealth: Payer: Self-pay | Admitting: Hematology & Oncology
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
## 2023-07-02 DEATH — deceased
